# Patient Record
Sex: Female | Born: 1970 | Race: White | Hispanic: No | Marital: Single | Smoking: Never smoker
Health system: Southern US, Community
[De-identification: ages and names within clinical notes are randomized; demographics above are authoritative.]

## PROBLEM LIST (undated history)

## (undated) DIAGNOSIS — G4730 Sleep apnea, unspecified: Secondary | ICD-10-CM

## (undated) DIAGNOSIS — M2550 Pain in unspecified joint: Secondary | ICD-10-CM

## (undated) DIAGNOSIS — R0602 Shortness of breath: Secondary | ICD-10-CM

## (undated) DIAGNOSIS — M25474 Effusion, right foot: Secondary | ICD-10-CM

## (undated) DIAGNOSIS — I10 Essential (primary) hypertension: Secondary | ICD-10-CM

## (undated) DIAGNOSIS — E785 Hyperlipidemia, unspecified: Secondary | ICD-10-CM

## (undated) DIAGNOSIS — R7303 Prediabetes: Secondary | ICD-10-CM

## (undated) DIAGNOSIS — E559 Vitamin D deficiency, unspecified: Secondary | ICD-10-CM

## (undated) DIAGNOSIS — M25471 Effusion, right ankle: Secondary | ICD-10-CM

## (undated) DIAGNOSIS — M549 Dorsalgia, unspecified: Secondary | ICD-10-CM

## (undated) DIAGNOSIS — F419 Anxiety disorder, unspecified: Secondary | ICD-10-CM

## (undated) DIAGNOSIS — J45909 Unspecified asthma, uncomplicated: Secondary | ICD-10-CM

## (undated) DIAGNOSIS — M25475 Effusion, left foot: Secondary | ICD-10-CM

## (undated) HISTORY — DX: Shortness of breath: R06.02

## (undated) HISTORY — DX: Hyperlipidemia, unspecified: E78.5

## (undated) HISTORY — DX: Effusion, left foot: M25.475

## (undated) HISTORY — DX: Prediabetes: R73.03

## (undated) HISTORY — DX: Vitamin D deficiency, unspecified: E55.9

## (undated) HISTORY — DX: Effusion, left ankle: M25.471

## (undated) HISTORY — DX: Unspecified asthma, uncomplicated: J45.909

## (undated) HISTORY — DX: Dorsalgia, unspecified: M54.9

## (undated) HISTORY — DX: Essential (primary) hypertension: I10

## (undated) HISTORY — PX: ADENOIDECTOMY: SUR15

## (undated) HISTORY — DX: Sleep apnea, unspecified: G47.30

## (undated) HISTORY — DX: Effusion, right ankle: M25.471

## (undated) HISTORY — DX: Effusion, right foot: M25.474

## (undated) HISTORY — DX: Anxiety disorder, unspecified: F41.9

## (undated) HISTORY — DX: Pain in unspecified joint: M25.50

---

## 2018-03-20 ENCOUNTER — Encounter: Payer: Self-pay | Admitting: Physician Assistant

## 2018-03-20 ENCOUNTER — Ambulatory Visit: Payer: BC Managed Care – PPO | Admitting: Physician Assistant

## 2018-03-20 ENCOUNTER — Other Ambulatory Visit: Payer: Self-pay

## 2018-03-20 ENCOUNTER — Ambulatory Visit (INDEPENDENT_AMBULATORY_CARE_PROVIDER_SITE_OTHER): Payer: BC Managed Care – PPO

## 2018-03-20 VITALS — BP 152/100 | HR 96 | Temp 97.9°F | Resp 16 | Ht 69.0 in | Wt >= 6400 oz

## 2018-03-20 DIAGNOSIS — R9431 Abnormal electrocardiogram [ECG] [EKG]: Secondary | ICD-10-CM

## 2018-03-20 DIAGNOSIS — I10 Essential (primary) hypertension: Secondary | ICD-10-CM

## 2018-03-20 DIAGNOSIS — E785 Hyperlipidemia, unspecified: Secondary | ICD-10-CM | POA: Diagnosis not present

## 2018-03-20 MED ORDER — CHLORTHALIDONE 25 MG PO TABS: 12.5000 mg | ORAL_TABLET | Freq: Every day | ORAL | 0 refills | Status: DC

## 2018-03-20 MED ORDER — METOPROLOL SUCCINATE ER 25 MG PO TB24: 25.0000 mg | ORAL_TABLET | Freq: Every day | ORAL | 0 refills | Status: DC

## 2018-03-20 NOTE — Progress Notes (Signed)
, °

## 2018-03-20 NOTE — Progress Notes (Signed)
03/23/2018 4:16 PM   DOB: 23-Sep-1971 / MRN: [id number]  SUBJECTIVE:  Sharon Robertson is a 47 y.o. female presenting for evaluation of hypertension.  She tells me she has no history of this.  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  She denies chest pain, shortness of breath, dizziness.  She has No Known Allergies.   She  has a past medical history of Asthma.    She  reports that she has never smoked. She has never used smokeless tobacco. She reports that she does not drink alcohol or use drugs. She  has no sexual activity history on file. The patient  has a past surgical history that includes Adenoidectomy.  Her family history includes Cancer in her father; Diabetes in her father, maternal grandmother, and mother; Heart disease in her brother, maternal grandfather, maternal grandmother, mother, paternal grandfather, and paternal grandmother; Hyperlipidemia in her father and mother; Hypertension in her father and mother.  Review of Systems  Constitutional: Negative for chills, diaphoresis and fever.  Eyes: Negative.   Respiratory: Negative for cough, hemoptysis, sputum production, shortness of breath and wheezing.   Cardiovascular: Negative for chest pain, orthopnea and leg swelling.  Gastrointestinal: Negative for abdominal pain, blood in stool, constipation, diarrhea, heartburn, melena, nausea and vomiting.  Genitourinary: Negative for dysuria, flank pain, frequency, hematuria and urgency.  Skin: Negative for rash.  Neurological: Negative for dizziness, sensory change, speech change, focal weakness and headaches.    The problem list and medications were reviewed and updated by myself where necessary and exist elsewhere in the encounter.   OBJECTIVE:  BP (!) 152/100 (BP Location: Right Arm)   Pulse 96   Temp 97.9 F (36.6 C) (Oral)   Resp 16   Ht  5\' 9"  (1.753 m)   Wt (!) 412 lb 6.4 oz (187.1 kg)   SpO2 99%   BMI 60.90 kg/m   Physical Exam  Constitutional: She appears well-developed and well-nourished. No distress.  Morbid obesity.  HENT:  Right Ear: External ear normal.  Left Ear: External ear normal.  Nose: Nose normal.  Mouth/Throat: Oropharynx is clear and moist. No oropharyngeal exudate.  Cardiovascular: Regular rhythm, S1 normal, S2 normal, normal heart sounds and intact distal pulses. Exam reveals no gallop, no friction rub and no decreased pulses.  No murmur heard. Pulmonary/Chest: Effort normal and breath sounds normal. No stridor. No respiratory distress. She has no wheezes. She has no rales.  Abdominal: Soft. Bowel sounds are normal. She exhibits no distension and no mass. There is no tenderness. There is no rebound and no guarding. No hernia.  Musculoskeletal: She exhibits edema (Trace edema bilaterally). She exhibits no tenderness.  Skin: She is not diaphoretic.   The 10-year ASCVD risk score Denman George(Goff DC Montez HagemanJr., et al., 2013) is: 2.1%   Values used to calculate the score:     Age: 89 years     Sex: Female     Is Non-Hispanic African American: No     Diabetic: No     Tobacco smoker: No     Systolic Blood Pressure: 152 mmHg     Is BP treated: Yes     HDL Cholesterol: 50 mg/dL     Total Cholesterol: 212 mg/dL   CLINICAL DATA:  New onset HTN with leg swelling. No CP or SOB.  EXAM: CHEST -  2 VIEW  COMPARISON:  None.  FINDINGS: The heart size and mediastinal contours are within normal limits. Both lungs are clear. The visualized skeletal structures are unremarkable.  IMPRESSION: No active cardiopulmonary disease.   Electronically Signed   By: Norva Pavlov M.D.   On: 03/20/2018 12:07   EKG shows normal sinus rhythm.  Normal axis.  There is no evidence of ischemia or infarction.  She does meet criteria for left ventricular hypertrophy.  No results found for this or any previous visit (from the  past 72 hour(s)).  No results found.  ASSESSMENT AND PLAN:  [name] was seen today for establish care.  Diagnoses and all orders for this visit:  Asymptomatic hypertension: The she needs some diuresis.  Her hypertension is most likely secondary to her weight and she would like to consider seeing a bariatric surgeon.  Given her findings on EKG I would like her to see cardiology first before we consider aggressive options for weight loss.  I will see her back in about 1 to 2 weeks for blood pressure titration.  I am sending her to [facility] for further evaluation of the LVH found on EKG. -     EKG 12-Lead -     DG Chest 2 View; Future -     CBC -     Hemoglobin A1c -     Basic metabolic panel -     Hepatic function panel -     Lipid panel -     TSH -     chlorthalidone (HYGROTON) 25 MG tablet; Take 0.5 tablets (12.5 mg total) by mouth daily. -     metoprolol succinate (TOPROL-XL) 25 MG 24 hr tablet; Take 1 tablet (25 mg total) by mouth daily.  Abnormal EKG -     Ambulatory referral to Cardiology    The patient is advised to call or return to clinic if she does not see an improvement in symptoms, or to seek the care of the closest emergency department if she worsens with the above plan.   [name], MHS, PA-C Primary Care at [facility] 03/23/2018 4:16 PM

## 2018-03-20 NOTE — Patient Instructions (Signed)
     IF you received an x-ray today, you will receive an invoice from [facility]. Please contact [facility] at [phone] with questions or concerns regarding your invoice.   IF you received labwork today, you will receive an invoice from [facility]. Please contact [facility] at [phone] with questions or concerns regarding your invoice.   Our billing staff will not be able to assist you with questions regarding bills from these companies.  You will be contacted with the lab results as soon as they are available. The fastest way to get your results is to activate your My Chart account. Instructions are located on the last page of this paperwork. If you have not heard from us regarding the results in 2 weeks, please contact this office.     

## 2018-03-22 LAB — CBC
HEMATOCRIT: 37.9 % (ref 34.0–46.6)
Hemoglobin: 11.8 g/dL (ref 11.1–15.9)
MCH: 24.8 pg — ABNORMAL LOW (ref 26.6–33.0)
MCHC: 31.1 g/dL — AB (ref 31.5–35.7)
MCV: 80 fL (ref 79–97)
Platelets: 461 10*3/uL — ABNORMAL HIGH (ref 150–379)
RBC: 4.75 x10E6/uL (ref 3.77–5.28)
RDW: 14.3 % (ref 12.3–15.4)
WBC: 8.4 10*3/uL (ref 3.4–10.8)

## 2018-03-22 LAB — HEPATIC FUNCTION PANEL
ALT: 17 IU/L (ref 0–32)
AST: 15 IU/L (ref 0–40)
Albumin: 4.1 g/dL (ref 3.5–5.5)
Alkaline Phosphatase: 93 IU/L (ref 39–117)
BILIRUBIN TOTAL: 0.3 mg/dL (ref 0.0–1.2)
BILIRUBIN, DIRECT: 0.08 mg/dL (ref 0.00–0.40)
Total Protein: 7.2 g/dL (ref 6.0–8.5)

## 2018-03-22 LAB — LIPID PANEL
CHOLESTEROL TOTAL: 212 mg/dL — AB (ref 100–199)
Chol/HDL Ratio: 4.2 ratio (ref 0.0–4.4)
HDL: 50 mg/dL (ref >39)
LDL Calculated: 145 mg/dL — ABNORMAL HIGH (ref 0–99)
Triglycerides: 86 mg/dL (ref 0–149)
VLDL Cholesterol Cal: 17 mg/dL (ref 5–40)

## 2018-03-22 LAB — BASIC METABOLIC PANEL
BUN / CREAT RATIO: 22 (ref 9–23)
BUN: 15 mg/dL (ref 6–24)
CHLORIDE: 100 mmol/L (ref 96–106)
CO2: 23 mmol/L (ref 20–29)
CREATININE: 0.67 mg/dL (ref 0.57–1.00)
Calcium: 9.2 mg/dL (ref 8.7–10.2)
GFR calc Af Amer: 122 mL/min/{1.73_m2} (ref >59)
GFR calc non Af Amer: 106 mL/min/{1.73_m2} (ref >59)
Glucose: 98 mg/dL (ref 65–99)
Potassium: 4.1 mmol/L (ref 3.5–5.2)
Sodium: 138 mmol/L (ref 134–144)

## 2018-03-22 LAB — TSH: TSH: 2.65 u[IU]/mL (ref 0.450–4.500)

## 2018-03-22 LAB — HEMOGLOBIN A1C
ESTIMATED AVERAGE GLUCOSE: 114 mg/dL
Hgb A1c MFr Bld: 5.6 % (ref 4.8–5.6)

## 2018-03-23 DIAGNOSIS — E785 Hyperlipidemia, unspecified: Secondary | ICD-10-CM | POA: Insufficient documentation

## 2018-03-23 DIAGNOSIS — I10 Essential (primary) hypertension: Secondary | ICD-10-CM | POA: Insufficient documentation

## 2018-03-31 ENCOUNTER — Encounter: Payer: Self-pay | Admitting: Physician Assistant

## 2018-03-31 ENCOUNTER — Other Ambulatory Visit: Payer: Self-pay

## 2018-03-31 ENCOUNTER — Ambulatory Visit: Payer: BC Managed Care – PPO | Admitting: Physician Assistant

## 2018-03-31 VITALS — BP 128/78 | HR 93 | Temp 97.8°F | Resp 20 | Ht 69.0 in | Wt >= 6400 oz

## 2018-03-31 DIAGNOSIS — I10 Essential (primary) hypertension: Secondary | ICD-10-CM

## 2018-03-31 DIAGNOSIS — Z1231 Encounter for screening mammogram for malignant neoplasm of breast: Secondary | ICD-10-CM | POA: Diagnosis not present

## 2018-03-31 DIAGNOSIS — R9431 Abnormal electrocardiogram [ECG] [EKG]: Secondary | ICD-10-CM

## 2018-03-31 MED ORDER — METOPROLOL SUCCINATE ER 25 MG PO TB24: 25.0000 mg | ORAL_TABLET | Freq: Every day | ORAL | 3 refills | Status: DC

## 2018-03-31 MED ORDER — CHLORTHALIDONE 25 MG PO TABS: 12.5000 mg | ORAL_TABLET | Freq: Every day | ORAL | 3 refills | Status: DC

## 2018-03-31 NOTE — Patient Instructions (Signed)
     IF you received an x-ray today, you will receive an invoice from [facility]. Please contact [facility] at [phone] with questions or concerns regarding your invoice.   IF you received labwork today, you will receive an invoice from [facility]. Please contact [facility] at [phone] with questions or concerns regarding your invoice.   Our billing staff will not be able to assist you with questions regarding bills from these companies.  You will be contacted with the lab results as soon as they are available. The fastest way to get your results is to activate your My Chart account. Instructions are located on the last page of this paperwork. If you have not heard from us regarding the results in 2 weeks, please contact this office.     

## 2018-03-31 NOTE — Progress Notes (Signed)
03/31/2018 8:44 AM   DOB: 08/13/1971 / MRN: [id number]  SUBJECTIVE:  Sharon Robertson is a 47 y.o. female presenting for recheck of hypertension.  Patient tells me she has surprised at how much better she is feeling at this time.  She is lost close to 10 pounds with the chlorthalidone and is also taking her metoprolol as prescribed.  She has yet to hear from cardiology plans to go once an appointment is set.  She is requesting a referral to bariatric surgery.  She is never had a serious health consequences due to her excess weight until the recent hypertension diagnosis.  She has now realized what is serious impact at this diagnosis had from a symptomatic standpoint once it was treated.  She has No Known Allergies.   She  has a past medical history of Asthma.    She  reports that she has never smoked. She has never used smokeless tobacco. She reports that she does not drink alcohol or use drugs. She  has no sexual activity history on file. The patient  has a past surgical history that includes Adenoidectomy.  Her family history includes Cancer in her father; Diabetes in her father, maternal grandmother, and mother; Heart disease in her brother, maternal grandfather, maternal grandmother, mother, paternal grandfather, and paternal grandmother; Hyperlipidemia in her father and mother; Hypertension in her father and mother.  Review of Systems  Constitutional: Negative for chills, diaphoresis and fever.  Eyes: Negative.   Respiratory: Negative for cough, hemoptysis, sputum production, shortness of breath and wheezing.   Cardiovascular: Negative for chest pain, orthopnea and leg swelling.  Gastrointestinal: Negative for nausea.  Skin: Negative for rash.  Neurological: Negative for dizziness, sensory change, speech change, focal weakness and headaches.    The problem list and medications were reviewed and updated by myself where necessary and exist elsewhere in the encounter.    OBJECTIVE:  BP 128/78 (BP Location: Left Arm, Patient Position: Sitting, Cuff Size: Large)   Pulse 93   Temp 97.8 F (36.6 C) (Oral)   Resp 20   Ht  (1.753 m)   Wt (!) 404 lb 6.4 oz (183.4 kg)   LMP 03/31/2018   SpO2 97%   BMI 59.72 kg/m   Physical Exam  Constitutional: She is oriented to person, place, and time. She appears well-nourished. No distress.  Eyes: Pupils are equal, round, and reactive to light. EOM are normal.  Cardiovascular: Normal rate, regular rhythm, S1 normal, S2 normal, normal heart sounds and intact distal pulses. Exam reveals no gallop, no friction rub and no decreased pulses.  No murmur heard. Pulmonary/Chest: Effort normal. No stridor. No respiratory distress. She has no wheezes. She has no rales.  Abdominal: She exhibits no distension.  Musculoskeletal: She exhibits no edema.  Neurological: She is alert and oriented to person, place, and time. No cranial nerve deficit. Gait normal.  Skin: Skin is dry. She is not diaphoretic.  Psychiatric: She has a normal mood and affect.  Vitals reviewed.   BP Readings from Last 3 Encounters:  03/31/18 128/78  03/20/18 (!) 152/100   Wt Readings from Last 3 Encounters:  03/31/18 (!) 404 lb 6.4 oz (183.4 kg)  03/20/18 (!) 412 lb 6.4 oz (187.1 kg)   The 10-year ASCVD risk score Denman George DC Jr., et al., 2013) is: 1.5%   Values used to calculate the score:     Age: 33 years     Sex: Female     Is Non-Hispanic  African American: No     Diabetic: No     Tobacco smoker: No     Systolic Blood Pressure: 128 mmHg     Is BP treated: Yes     HDL Cholesterol: 50 mg/dL     Total Cholesterol: 212 mg/dL    No results found for this or any previous visit (from the past 72 hour(s)).  No results found.  ASSESSMENT AND PLAN:  Sharon Robertson was seen today for hypertension and follow-up.  Diagnoses and all orders for this visit:  Well-controlled hypertension -     Renal Function Panel -     Care  order/instruction:  Nonspecific abnormal electrocardiogram (ECG) (EKG): Awaiting cardiology   Asymptomatic hypertension -     metoprolol succinate (TOPROL-XL) 25 MG 24 hr tablet; Take 1 tablet (25 mg total) by mouth daily. -     chlorthalidone (HYGROTON) 25 MG tablet; Take 0.5 tablets (12.5 mg total) by mouth daily.  Visit for screening mammogram -     Ambulatory referral to Obstetrics / Gynecology  Morbid obesity [facility]) -     Ambulatory referral to General Surgery    The patient is advised to call or return to clinic if she does not see an improvement in symptoms, or to seek the care of the closest emergency department if she worsens with the above plan.   [name], MHS, PA-C Primary Care at [facility] 03/31/2018 8:44 AM

## 2018-04-01 LAB — RENAL FUNCTION PANEL
ALBUMIN: 3.8 g/dL (ref 3.5–5.5)
BUN/Creatinine Ratio: 29 — ABNORMAL HIGH (ref 9–23)
BUN: 20 mg/dL (ref 6–24)
CHLORIDE: 101 mmol/L (ref 96–106)
CO2: 23 mmol/L (ref 20–29)
Calcium: 9.2 mg/dL (ref 8.7–10.2)
Creatinine, Ser: 0.7 mg/dL (ref 0.57–1.00)
GFR calc Af Amer: 120 mL/min/{1.73_m2} (ref >59)
GFR, EST NON AFRICAN AMERICAN: 104 mL/min/{1.73_m2} (ref >59)
GLUCOSE: 105 mg/dL — AB (ref 65–99)
POTASSIUM: 4.4 mmol/L (ref 3.5–5.2)
Phosphorus: 2.5 mg/dL (ref 2.5–4.5)
SODIUM: 139 mmol/L (ref 134–144)

## 2018-07-19 ENCOUNTER — Other Ambulatory Visit (HOSPITAL_COMMUNITY): Payer: Self-pay | Admitting: General Surgery

## 2018-07-22 ENCOUNTER — Ambulatory Visit (HOSPITAL_COMMUNITY)
Admission: RE | Admit: 2018-07-22 | Discharge: 2018-07-22 | Disposition: A | Discharge status: Home / Self Care | Payer: BC Managed Care – PPO | Source: Ambulatory Visit | Attending: General Surgery | Admitting: General Surgery

## 2018-08-23 ENCOUNTER — Encounter: Payer: Self-pay | Admitting: Skilled Nursing Facility1

## 2018-08-23 ENCOUNTER — Encounter: Payer: BC Managed Care – PPO | Attending: General Surgery | Admitting: Skilled Nursing Facility1

## 2018-08-23 DIAGNOSIS — Z8042 Family history of malignant neoplasm of prostate: Secondary | ICD-10-CM | POA: Diagnosis not present

## 2018-08-23 DIAGNOSIS — Z713 Dietary counseling and surveillance: Secondary | ICD-10-CM | POA: Insufficient documentation

## 2018-08-23 DIAGNOSIS — R609 Edema, unspecified: Secondary | ICD-10-CM | POA: Diagnosis not present

## 2018-08-23 DIAGNOSIS — Z8261 Family history of arthritis: Secondary | ICD-10-CM | POA: Insufficient documentation

## 2018-08-23 DIAGNOSIS — I10 Essential (primary) hypertension: Secondary | ICD-10-CM | POA: Diagnosis not present

## 2018-08-23 DIAGNOSIS — J45909 Unspecified asthma, uncomplicated: Secondary | ICD-10-CM | POA: Diagnosis not present

## 2018-08-23 DIAGNOSIS — Z8249 Family history of ischemic heart disease and other diseases of the circulatory system: Secondary | ICD-10-CM | POA: Insufficient documentation

## 2018-08-23 DIAGNOSIS — Z833 Family history of diabetes mellitus: Secondary | ICD-10-CM | POA: Insufficient documentation

## 2018-08-23 DIAGNOSIS — E785 Hyperlipidemia, unspecified: Secondary | ICD-10-CM | POA: Diagnosis not present

## 2018-08-23 DIAGNOSIS — K219 Gastro-esophageal reflux disease without esophagitis: Secondary | ICD-10-CM | POA: Insufficient documentation

## 2018-08-23 NOTE — Progress Notes (Signed)
Pre-Op Assessment Visit:  Pre-Operative Sleeve Gastrectomy Surgery  Medical Nutrition Therapy:  Appt start time: 4:45  End time:  6:00  Patient was seen on 08/24/2018 for Pre-Operative Nutrition Assessment. [facility] on 08/24/2018.   Pt states she will finish her masters in April. Pt states she works nights on the weekends. Pt states she feels she has more time to put into her food choices at this time in her life. Pt states she has started to look into her emotional eating and relationship with food. Pt states her psych eval with Dr. Cyndia SkeetersLurey did not go well stating he had an "angery tone which was condesending" and she states she "felt attacked" Pt states that appointment was "triggering" for her.   Pt seems mentally ready for the commitment of surgery.   Pt expectation of surgery: to be healthier and move more  Pt expectation of Dietitian: none   Start weight at [facility]: 421.7 BMI: 62.27   24 hr Dietary Recall: aiming to eat at least 1 vegetable a day First Meal: coffee and bagel or oatmeal or grits Snack: granola bars or yogurt Second Meal: chicken and vegetable  Snack: pretzels  Third Meal: salad or chicken or steak or fish Snack:  Beverages: 40 ounces water, coffee with half anf half and splenda, unsweet tea  Encouraged to engage in 150 minutes of moderate physical activity including cardiovascular and weight baring weekly  Handouts given during visit include:  . Pre-Op Goals . Bariatric Surgery Protein Shakes During the appointment today the following Pre-Op Goals were reviewed with the patient: . Maintain or lose weight as instructed by your surgeon . Make healthy food choices . Begin to limit portion sizes . Limited concentrated sugars and fried foods . Keep fat/sugar in the single digits per serving on             food labels . Practice CHEWING your food  (aim for 30 chews per  bite or until applesauce consistency) . Practice not drinking 15 minutes before, during, and 30 minutes after each meal/snack . Avoid all carbonated beverages  . Avoid/limit caffeinated beverages  . Avoid all sugar-sweetened beverages . Consume 3 meals per day; eat every 3-5 hours . Make a list of non-food related activities . Aim for 64-100 ounces of FLUID daily  . Aim for at least 60-80 grams of PROTEIN daily . Look for a liquid protein source that contain ?15 g protein and ?5 g carbohydrate  (ex: shakes, drinks, shots)  -Follow diet recommendations listed below   Energy and Macronutrient Recomendations: Calories: 1500 Carbohydrate: 170 Protein: 112 Fat: 42  Demonstrated degree of understanding via:  Teach Back  Teaching Method Utilized:  Visual Auditory Hands on  Barriers to learning/adherence to lifestyle change: none identified   Patient to call the [facility] to enroll in Pre-Op and Post-Op Nutrition Education when surgery date is scheduled.

## 2019-05-25 ENCOUNTER — Other Ambulatory Visit: Payer: Self-pay | Admitting: Physician Assistant

## 2019-05-25 DIAGNOSIS — I10 Essential (primary) hypertension: Secondary | ICD-10-CM

## 2019-05-26 ENCOUNTER — Other Ambulatory Visit: Payer: Self-pay | Admitting: Physician Assistant

## 2019-05-26 DIAGNOSIS — I10 Essential (primary) hypertension: Secondary | ICD-10-CM

## 2019-06-26 ENCOUNTER — Other Ambulatory Visit: Payer: Self-pay | Admitting: Family Medicine

## 2019-06-26 DIAGNOSIS — I10 Essential (primary) hypertension: Secondary | ICD-10-CM

## 2019-07-05 ENCOUNTER — Other Ambulatory Visit: Payer: Self-pay

## 2019-07-05 ENCOUNTER — Ambulatory Visit: Payer: BC Managed Care – PPO | Admitting: Registered Nurse

## 2019-07-05 ENCOUNTER — Encounter: Payer: Self-pay | Admitting: Registered Nurse

## 2019-07-05 VITALS — BP 130/86 | HR 82 | Temp 98.4°F | Resp 16 | Ht 69.0 in | Wt >= 6400 oz

## 2019-07-05 DIAGNOSIS — Z1329 Encounter for screening for other suspected endocrine disorder: Secondary | ICD-10-CM

## 2019-07-05 DIAGNOSIS — Z130 Encounter for screening for diseases of the blood and blood-forming organs and certain disorders involving the immune mechanism: Secondary | ICD-10-CM | POA: Diagnosis not present

## 2019-07-05 DIAGNOSIS — Z13220 Encounter for screening for lipoid disorders: Secondary | ICD-10-CM

## 2019-07-05 DIAGNOSIS — Z13228 Encounter for screening for other metabolic disorders: Secondary | ICD-10-CM | POA: Diagnosis not present

## 2019-07-05 NOTE — Progress Notes (Signed)
Established Patient Office Visit  Subjective:  Patient ID: Sharon Robertson, female    DOB: 09/30/1971  Age: 48 y.o. MRN: [id number]  CC:  Chief Complaint  Patient presents with  . Establish Care    need new pcp to manage medications   . Medication Refill    HPI Sharon Robertson presents for visit to establish care. Formerly a patient of Sharon Robertson, she is being treated by [place] for her HTN.  She takes her BP at home and at work, as she is an Therapist, sports at [employer]. Reports it has been steady. She is taking chlorthalidone 25 mg PO qd and metoprolol XR 25mg  PO qd. She has a substantial history of heart disease in her family.  Otherwise, she feels healthy. She received an eye exam last Thursday, 06/30/19, and had a pap/well woman visit with her Gyn in 04/2019 with normal findings.  She reports that while she is trying to lose weight, she has gone down two pant sizes, but has not seen much of a change on the scale - this is concerning for her. We had a discussion about diet and exercise, and discussed that muscle, being more dense than fat, can cause a change in body composition that can shrink the waistline while the scale stays steady.  Past Medical History:  Diagnosis Date  . Asthma   . Hypertension     Past Surgical History:  Procedure Laterality Date  . ADENOIDECTOMY     childhood    Family History  Problem Relation Age of Onset  . Diabetes Mother   . Heart disease Mother   . Hyperlipidemia Mother   . Hypertension Mother   . Cancer Father        prostate  . Diabetes Father   . Hyperlipidemia Father   . Hypertension Father   . Heart disease Brother   . Heart disease Maternal Grandmother   . Diabetes Maternal Grandmother   . Heart disease Maternal Grandfather   . Heart disease Paternal Grandmother   . Heart disease Paternal Grandfather     Social History   Socioeconomic History  . Marital status: Single    Spouse name: Not on file  . Number of children: Not on file   . Years of education: Not on file  . Highest education level: Not on file  Occupational History  . Not on file  Social Needs  . Financial resource strain: Not hard at all  . Food insecurity    Worry: Never true    Inability: Never true  . Transportation needs    Medical: No    Non-medical: No  Tobacco Use  . Smoking status: Never Smoker  . Smokeless tobacco: Never Used  Substance and Sexual Activity  . Alcohol use: Never    Frequency: Never  . Drug use: Never  . Sexual activity: Not on file  Lifestyle  . Physical activity    Days per week: 3 days    Minutes per session: 30 min  . Stress: Not at all  Relationships  . Social Herbalist on phone: Three times a week    Gets together: Twice a week    Attends religious service: Patient refused    Active member of club or organization: Patient refused    Attends meetings of clubs or organizations: Patient refused    Relationship status: Patient refused  . Intimate partner violence    Fear of current or ex partner: No  Emotionally abused: No    Physically abused: No    Forced sexual activity: No  Other Topics Concern  . Not on file  Social History Narrative  . Not on file    Outpatient Medications Prior to Visit  Medication Sig Dispense Refill  . cetirizine (ZYRTEC) 10 MG tablet Take 10 mg by mouth daily.    . chlorthalidone (HYGROTON) 25 MG tablet TAKE 0.5 TABLETS (12.5 MG TOTAL) BY MOUTH DAILY. 45 tablet 0  . metoprolol succinate (TOPROL-XL) 25 MG 24 hr tablet TAKE 1 TABLET BY MOUTH EVERY DAY 30 tablet 0   No facility-administered medications prior to visit.     No Known Allergies  ROS Review of Systems  Constitutional: Negative.   HENT: Negative.   Eyes: Negative.   Respiratory: Negative.   Cardiovascular: Negative.   Gastrointestinal: Negative.   Endocrine: Negative.   Genitourinary: Negative.   Musculoskeletal: Negative.   Skin: Negative.   Allergic/Immunologic: Negative.   Neurological:  Negative.   Hematological: Negative.   Psychiatric/Behavioral: Negative.       Objective:    Physical Exam  Constitutional: She is oriented to person, place, and time. She appears well-developed and well-nourished.  Cardiovascular: Normal rate and regular rhythm.  Pulmonary/Chest: Effort normal. No respiratory distress.  Neurological: She is alert and oriented to person, place, and time.  Skin: Skin is warm and dry. No rash noted. No erythema. No pallor.  Psychiatric: She has a normal mood and affect. Her behavior is normal. Judgment and thought content normal.  Nursing note and vitals reviewed.   BP 130/86   Pulse 82   Temp 98.4 F (36.9 C) (Oral)   Resp 16   Ht 5\' 9"  (1.753 m)   Wt (!) 408 lb (185.1 kg)   LMP 06/21/2019 (Approximate)   SpO2 98%   BMI 60.25 kg/m  Wt Readings from Last 3 Encounters:  07/05/19 (!) 408 lb (185.1 kg)  08/23/18 (!) 421 lb 11.2 oz (191.3 kg)  03/31/18 (!) 404 lb 6.4 oz (183.4 kg)     Health Maintenance Due  Topic Date Due  . HIV Screening  05/13/1986  . PAP SMEAR-Modifier  03/31/2014  . INFLUENZA VACCINE  07/02/2019    There are no preventive care reminders to display for this patient.  Lab Results  Component Value Date   TSH 2.650 03/20/2018   Lab Results  Component Value Date   WBC 8.4 03/20/2018   HGB 11.8 03/20/2018   HCT 37.9 03/20/2018   MCV 80 03/20/2018   PLT 461 (H) 03/20/2018   Lab Results  Component Value Date   NA 139 03/31/2018   K 4.4 03/31/2018   CO2 23 03/31/2018   GLUCOSE 105 (H) 03/31/2018   BUN 20 03/31/2018   CREATININE 0.70 03/31/2018   BILITOT 0.3 03/20/2018   ALKPHOS 93 03/20/2018   AST 15 03/20/2018   ALT 17 03/20/2018   PROT 7.2 03/20/2018   ALBUMIN 3.8 03/31/2018   CALCIUM 9.2 03/31/2018   Lab Results  Component Value Date   CHOL 212 (H) 03/20/2018   Lab Results  Component Value Date   HDL 50 03/20/2018   Lab Results  Component Value Date   LDLCALC 145 (H) 03/20/2018   Lab  Results  Component Value Date   TRIG 86 03/20/2018   Lab Results  Component Value Date   CHOLHDL 4.2 03/20/2018   Lab Results  Component Value Date   HGBA1C 5.6 03/20/2018      Assessment &  Plan:   Problem List Items Addressed This Visit    None    Visit Diagnoses    Screening for endocrine, metabolic and immunity disorder    -  Primary   Relevant Orders   CBC with Differential/Platelet   Comprehensive metabolic panel   Hemoglobin A1c   TSH   Lipid screening       Relevant Orders   Lipid panel      No orders of the defined types were placed in this encounter.   Follow-up: No follow-ups on file.   PLAN  Labs drawn, will follow up as warranted.  Meds refilled x 1 year, will see her then if not sooner.  Patient encouraged to call clinic with any questions, comments, or concerns.   Janeece Ageeichard Jeremia Groot, NP

## 2019-07-06 ENCOUNTER — Other Ambulatory Visit: Payer: Self-pay | Admitting: Registered Nurse

## 2019-07-06 DIAGNOSIS — E785 Hyperlipidemia, unspecified: Secondary | ICD-10-CM

## 2019-07-06 LAB — CBC WITH DIFFERENTIAL/PLATELET
Basophils Absolute: 0.1 10*3/uL (ref 0.0–0.2)
Basos: 1 %
EOS (ABSOLUTE): 0.2 10*3/uL (ref 0.0–0.4)
Eos: 3 %
Hematocrit: 37.7 % (ref 34.0–46.6)
Hemoglobin: 12 g/dL (ref 11.1–15.9)
Immature Grans (Abs): 0 10*3/uL (ref 0.0–0.1)
Immature Granulocytes: 0 %
Lymphocytes Absolute: 2.3 10*3/uL (ref 0.7–3.1)
Lymphs: 27 %
MCH: 24.6 pg — ABNORMAL LOW (ref 26.6–33.0)
MCHC: 31.8 g/dL (ref 31.5–35.7)
MCV: 77 fL — ABNORMAL LOW (ref 79–97)
Monocytes Absolute: 0.5 10*3/uL (ref 0.1–0.9)
Monocytes: 6 %
Neutrophils Absolute: 5.2 10*3/uL (ref 1.4–7.0)
Neutrophils: 63 %
Platelets: 441 10*3/uL (ref 150–450)
RBC: 4.88 x10E6/uL (ref 3.77–5.28)
RDW: 14 % (ref 11.7–15.4)
WBC: 8.2 10*3/uL (ref 3.4–10.8)

## 2019-07-06 LAB — COMPREHENSIVE METABOLIC PANEL
ALT: 25 IU/L (ref 0–32)
AST: 17 IU/L (ref 0–40)
Albumin/Globulin Ratio: 1.4 (ref 1.2–2.2)
Albumin: 4.1 g/dL (ref 3.8–4.8)
Alkaline Phosphatase: 82 IU/L (ref 39–117)
BUN/Creatinine Ratio: 20 (ref 9–23)
BUN: 13 mg/dL (ref 6–24)
Bilirubin Total: 0.3 mg/dL (ref 0.0–1.2)
CO2: 23 mmol/L (ref 20–29)
Calcium: 9.5 mg/dL (ref 8.7–10.2)
Chloride: 100 mmol/L (ref 96–106)
Creatinine, Ser: 0.65 mg/dL (ref 0.57–1.00)
GFR calc Af Amer: 121 mL/min/{1.73_m2} (ref >59)
GFR calc non Af Amer: 105 mL/min/{1.73_m2} (ref >59)
Globulin, Total: 3 g/dL (ref 1.5–4.5)
Glucose: 105 mg/dL — ABNORMAL HIGH (ref 65–99)
Potassium: 4.5 mmol/L (ref 3.5–5.2)
Sodium: 137 mmol/L (ref 134–144)
Total Protein: 7.1 g/dL (ref 6.0–8.5)

## 2019-07-06 LAB — LIPID PANEL
Chol/HDL Ratio: 4.7 ratio — ABNORMAL HIGH (ref 0.0–4.4)
Cholesterol, Total: 259 mg/dL — ABNORMAL HIGH (ref 100–199)
HDL: 55 mg/dL (ref >39)
LDL Calculated: 179 mg/dL — ABNORMAL HIGH (ref 0–99)
Triglycerides: 123 mg/dL (ref 0–149)
VLDL Cholesterol Cal: 25 mg/dL (ref 5–40)

## 2019-07-06 LAB — HEMOGLOBIN A1C
Est. average glucose Bld gHb Est-mCnc: 123 mg/dL
Hgb A1c MFr Bld: 5.9 % — ABNORMAL HIGH (ref 4.8–5.6)

## 2019-07-06 LAB — TSH: TSH: 1.99 u[IU]/mL (ref 0.450–4.500)

## 2019-07-06 MED ORDER — ATORVASTATIN CALCIUM 10 MG PO TABS: 10.0000 mg | ORAL_TABLET | Freq: Every day | ORAL | 1 refills | Status: DC

## 2019-07-06 NOTE — Progress Notes (Signed)
Good afternoon Sharon Robertson,  I left you a voicemail, but in case it gets cut off, I wanted to send you a message as well. 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 Your metabolic panel shows a glucose of 105. This is not concerning. Your TSH is normal. Your A1c is elevated to 5.9% - this is technically a "prediabetic" range, meaning it's not actionable from a pharmacology standpoint, but just as more of a "red flag" in emphasizing that you should limit processed foods, eat mostly vegetables, and rule out sugary drinks. Your Lipid panel has both your cholesterol and LDL taking a jump up from last year - this can be familial, or related to your diet - but I'm tempted to believe the former since you've been working diligently on losing weight. Regardless, this warrants the initiation of Atorvastatin 10mg  PO every evening. This is a low dose of a moderate intensity statin - a very well tolerated and well studied medication that helps your body lower your lipids. There are often no side effects, but if you notice muscle aches, changes to your urine, or stomach pain, please give me a call. I will send this over to your pharmacy Ideally, I'd like to see you back in around 6 months for lab work and a med check - as long as we can see those lipids trending down, we will be in great shape! If not, we can discuss bumping up the atorvastatin to 20mg . If you have any questions, feel free to call or message me via MyChart. It was a pleasure to meet you, looking forward to seeing you soon,  Kathrin Ruddy, NP

## 2019-07-06 NOTE — Progress Notes (Signed)
Left VM and sent results message -  Elevated lipids: cholesterol and LDL Start Atorvastatin 10mg  PO qpm  Follow up in 6 mos with lipid panel and med check  Kathrin Ruddy, NP

## 2019-07-12 ENCOUNTER — Other Ambulatory Visit: Payer: Self-pay | Admitting: Family Medicine

## 2019-07-12 DIAGNOSIS — I10 Essential (primary) hypertension: Secondary | ICD-10-CM

## 2019-07-12 DIAGNOSIS — Z1231 Encounter for screening mammogram for malignant neoplasm of breast: Secondary | ICD-10-CM

## 2019-08-12 ENCOUNTER — Ambulatory Visit
Admission: RE | Admit: 2019-08-12 | Discharge: 2019-08-12 | Disposition: A | Discharge status: Home / Self Care | Payer: BC Managed Care – PPO | Source: Ambulatory Visit | Attending: Family Medicine | Admitting: Family Medicine

## 2019-08-12 ENCOUNTER — Other Ambulatory Visit: Payer: Self-pay

## 2019-08-12 DIAGNOSIS — Z1231 Encounter for screening mammogram for malignant neoplasm of breast: Secondary | ICD-10-CM

## 2019-08-14 ENCOUNTER — Other Ambulatory Visit: Payer: Self-pay | Admitting: Family Medicine

## 2019-08-14 DIAGNOSIS — I10 Essential (primary) hypertension: Secondary | ICD-10-CM

## 2019-08-15 NOTE — Telephone Encounter (Signed)
Requested medication (s) are due for refill today: yes  Requested medication (s) are on the active medication list: yes  Last refill:  05/26/2019  Future visit scheduled:no  Notes to clinic:  vm left for patient to contact office to schedule visit   Requested Prescriptions  Pending Prescriptions Disp Refills   chlorthalidone (HYGROTON) 25 MG tablet [Pharmacy Med Name: CHLORTHALIDONE 25 MG TABLET] 45 tablet 0    Sig: TAKE 1/2 TABLET BY MOUTH EVERY DAY     Cardiovascular: Diuretics - Thiazide Passed - 08/14/2019  2:12 PM      Passed - Ca in normal range and within 360 days    Calcium  Date Value Ref Range Status  07/05/2019 9.5 8.7 - 10.2 mg/dL Final         Passed - Cr in normal range and within 360 days    Creatinine, Ser  Date Value Ref Range Status  07/05/2019 0.65 0.57 - 1.00 mg/dL Final         Passed - K in normal range and within 360 days    Potassium  Date Value Ref Range Status  07/05/2019 4.5 3.5 - 5.2 mmol/L Final         Passed - Na in normal range and within 360 days    Sodium  Date Value Ref Range Status  07/05/2019 137 134 - 144 mmol/L Final         Passed - Last BP in normal range    BP Readings from Last 1 Encounters:  07/05/19 130/86         Passed - Valid encounter within last 6 months    Recent Outpatient Visits          1 month ago Screening for endocrine, metabolic and immunity disorder   [facility], Delfino Lovett, NP   1 year ago Well-controlled hypertension   [facility], Audrie Lia, PA-C   1 year ago Asymptomatic hypertension   [facility] at [facility], Audrie Lia, PA-C

## 2019-09-05 ENCOUNTER — Other Ambulatory Visit: Payer: Self-pay | Admitting: Registered Nurse

## 2019-09-05 DIAGNOSIS — I10 Essential (primary) hypertension: Secondary | ICD-10-CM

## 2019-09-30 ENCOUNTER — Other Ambulatory Visit: Payer: Self-pay | Admitting: Registered Nurse

## 2019-09-30 DIAGNOSIS — I10 Essential (primary) hypertension: Secondary | ICD-10-CM

## 2019-10-24 ENCOUNTER — Other Ambulatory Visit: Payer: Self-pay | Admitting: Registered Nurse

## 2019-10-24 DIAGNOSIS — I10 Essential (primary) hypertension: Secondary | ICD-10-CM

## 2019-10-24 NOTE — Telephone Encounter (Signed)
Refilled medication for 30 days. 

## 2019-11-19 ENCOUNTER — Other Ambulatory Visit: Payer: Self-pay | Admitting: Registered Nurse

## 2019-11-19 DIAGNOSIS — I10 Essential (primary) hypertension: Secondary | ICD-10-CM

## 2019-12-15 ENCOUNTER — Other Ambulatory Visit: Payer: Self-pay | Admitting: Registered Nurse

## 2019-12-15 DIAGNOSIS — I10 Essential (primary) hypertension: Secondary | ICD-10-CM

## 2019-12-15 NOTE — Telephone Encounter (Signed)
Your patient 

## 2019-12-27 ENCOUNTER — Other Ambulatory Visit: Payer: Self-pay | Admitting: Registered Nurse

## 2019-12-27 DIAGNOSIS — E785 Hyperlipidemia, unspecified: Secondary | ICD-10-CM

## 2020-01-04 ENCOUNTER — Other Ambulatory Visit: Payer: Self-pay

## 2020-01-04 ENCOUNTER — Encounter: Payer: Self-pay | Admitting: Registered Nurse

## 2020-01-04 ENCOUNTER — Ambulatory Visit (INDEPENDENT_AMBULATORY_CARE_PROVIDER_SITE_OTHER): Payer: BC Managed Care – PPO | Admitting: Registered Nurse

## 2020-01-04 VITALS — BP 138/94 | HR 111 | Temp 97.2°F | Ht 69.0 in | Wt >= 6400 oz

## 2020-01-04 DIAGNOSIS — I10 Essential (primary) hypertension: Secondary | ICD-10-CM | POA: Diagnosis not present

## 2020-01-04 DIAGNOSIS — R7303 Prediabetes: Secondary | ICD-10-CM

## 2020-01-04 DIAGNOSIS — E785 Hyperlipidemia, unspecified: Secondary | ICD-10-CM

## 2020-01-04 DIAGNOSIS — Z6841 Body Mass Index (BMI) 40.0 and over, adult: Secondary | ICD-10-CM

## 2020-01-04 MED ORDER — ATORVASTATIN CALCIUM 10 MG PO TABS: 10.0000 mg | ORAL_TABLET | Freq: Every day | ORAL | 3 refills | Status: DC

## 2020-01-04 NOTE — Progress Notes (Signed)
Established Patient Office Visit  Subjective:  Patient ID: Sharon Robertson, female    DOB: 1971/03/09  Age: 49 y.o. MRN: [id number]  CC:  Chief Complaint  Patient presents with  . Medication Refill    for atorvastatin    HPI Sharon Robertson presents for labs and med refill  Needs atorvastatin refilled. Has been tolerating well.   Has been continuing on mediterranean diet and exercising minimum 3 days a week for 30 minutes at a time. 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 Enjoys it. 1900ish students on campus this semester. Not seeing too many complex cases at the moment.  Past Medical History:  Diagnosis Date  . Asthma   . Hypertension     Past Surgical History:  Procedure Laterality Date  . ADENOIDECTOMY     childhood    Family History  Problem Relation Age of Onset  . Diabetes Mother   . Heart disease Mother   . Hyperlipidemia Mother   . Hypertension Mother   . Cancer Father        prostate  . Diabetes Father   . Hyperlipidemia Father   . Hypertension Father   . Heart disease Brother   . Heart disease Maternal Grandmother   . Diabetes Maternal Grandmother   . Heart disease Maternal Grandfather   . Heart disease Paternal Grandmother   . Heart disease Paternal Grandfather     Social History   Socioeconomic History  . Marital status: Single    Spouse name: Not on file  . Number of children: Not on file  . Years of education: Not on file  . Highest education level: Not on file  Occupational History  . Not on file  Tobacco Use  . Smoking status: Never Smoker  . Smokeless tobacco: Never Used  Substance and Sexual Activity  . Alcohol use: Never  . Drug use: Never  . Sexual activity: Not on file  Other Topics Concern  . Not on file  Social History Narrative  . Not on file   Social Determinants of Health   Financial Resource Strain: Low Risk   . Difficulty of Paying Living  Expenses: Not hard at all  Food Insecurity: No Food Insecurity  . Worried About Charity fundraiser in the Last Year: Never true  . Ran Out of Food in the Last Year: Never true  Transportation Needs: No Transportation Needs  . Lack of Transportation (Medical): No  . Lack of Transportation (Non-Medical): No  Physical Activity: Insufficiently Active  . Days of Exercise per Week: 3 days  . Minutes of Exercise per Session: 30 min  Stress: No Stress Concern Present  . Feeling of Stress : Not at all  Social Connections: Unknown  . Frequency of Communication with Friends and Family: Three times a week  . Frequency of Social Gatherings with Friends and Family: Twice a week  . Attends Religious Services: Patient refused  . Active Member of Clubs or Organizations: Patient refused  . Attends Archivist Meetings: Patient refused  . Marital Status: Patient refused  Intimate Partner Violence: Not At Risk  . Fear of Current or Ex-Partner: No  . Emotionally Abused: No  . Physically Abused: No  . Sexually Abused: No    Outpatient Medications Prior to Visit  Medication Sig Dispense Refill  . atorvastatin (LIPITOR) 10 MG tablet TAKE 1 TABLET BY MOUTH EVERY DAY 90 tablet 0  .  cetirizine (ZYRTEC) 10 MG tablet Take 10 mg by mouth daily.    . chlorthalidone (HYGROTON) 25 MG tablet TAKE 1/2 TABLET BY MOUTH EVERY DAY 90 tablet 3  . metoprolol succinate (TOPROL-XL) 25 MG 24 hr tablet TAKE 1 TABLET BY MOUTH EVERY DAY 30 tablet 1   No facility-administered medications prior to visit.    No Known Allergies  ROS Review of Systems  Constitutional: Negative.   HENT: Negative.   Eyes: Negative.   Respiratory: Negative.   Cardiovascular: Negative.   Gastrointestinal: Negative.   Endocrine: Negative.   Genitourinary: Negative.   Musculoskeletal: Negative.   Skin: Negative.   Allergic/Immunologic: Negative.   Neurological: Negative.   Hematological: Negative.   Psychiatric/Behavioral:  Negative.   All other systems reviewed and are negative.     Objective:    Physical Exam  Constitutional: She is oriented to person, place, and time. She appears well-developed and well-nourished. No distress.  Cardiovascular: Normal rate, regular rhythm and normal heart sounds.  Pulmonary/Chest: Effort normal. No respiratory distress.  Neurological: She is alert and oriented to person, place, and time.  Skin: Skin is warm and dry. No rash noted. She is not diaphoretic. No erythema. No pallor.  Psychiatric: She has a normal mood and affect. Her behavior is normal. Judgment and thought content normal.  Nursing note and vitals reviewed.   BP (!) 138/94   Pulse (!) 111   Temp (!) 97.2 F (36.2 C) (Temporal)   Ht 5\' 9"  (1.753 m)   Wt (!) 411 lb (186.4 kg)   SpO2 96%   BMI 60.69 kg/m  Wt Readings from Last 3 Encounters:  01/04/20 (!) 411 lb (186.4 kg)  07/05/19 (!) 408 lb (185.1 kg)  08/23/18 (!) 421 lb 11.2 oz (191.3 kg)     Health Maintenance Due  Topic Date Due  . HIV Screening  05/13/1986  . PAP SMEAR-Modifier  03/31/2014    There are no preventive care reminders to display for this patient.  Lab Results  Component Value Date   TSH 1.990 07/05/2019   Lab Results  Component Value Date   WBC 8.2 07/05/2019   HGB 12.0 07/05/2019   HCT 37.7 07/05/2019   MCV 77 (L) 07/05/2019   PLT 441 07/05/2019   Lab Results  Component Value Date   NA 137 07/05/2019   K 4.5 07/05/2019   CO2 23 07/05/2019   GLUCOSE 105 (H) 07/05/2019   BUN 13 07/05/2019   CREATININE 0.65 07/05/2019   BILITOT 0.3 07/05/2019   ALKPHOS 82 07/05/2019   AST 17 07/05/2019   ALT 25 07/05/2019   PROT 7.1 07/05/2019   ALBUMIN 4.1 07/05/2019   CALCIUM 9.5 07/05/2019   Lab Results  Component Value Date   CHOL 259 (H) 07/05/2019   Lab Results  Component Value Date   HDL 55 07/05/2019   Lab Results  Component Value Date   LDLCALC 179 (H) 07/05/2019   Lab Results  Component Value Date    TRIG 123 07/05/2019   Lab Results  Component Value Date   CHOLHDL 4.7 (H) 07/05/2019   Lab Results  Component Value Date   HGBA1C 5.9 (H) 07/05/2019      Assessment & Plan:   Problem List Items Addressed This Visit      Other   Dyslipidemia   Relevant Orders   Lipid Panel    Other Visit Diagnoses    Well-controlled hypertension    -  Primary   Relevant Orders  Lipid Panel   Hemoglobin A1c   CBC   Prediabetes       Relevant Orders   Lipid Panel   Hemoglobin A1c   Class 3 severe obesity with body mass index (BMI) of 60.0 to 69.9 in adult, unspecified obesity type, unspecified whether serious comorbidity present (HCC)       Relevant Orders   Amb Ref to Medical Weight Management      Meds ordered this encounter  Medications  . atorvastatin (LIPITOR) 10 MG tablet    Sig: Take 1 tablet (10 mg total) by mouth daily.    Dispense:  90 tablet    Refill:  3    Order Specific Question:   Supervising Provider    Answer:   Doristine Bosworth [id number]    Follow-up: No follow-ups on file.   PLAN  Labs drawn. If lipids continue rising or stay steady, may plan on 6 mo follow up.  Recheck a1c and cbc to monitor past abnormalities  Resume atorvastatin 10mg  PO qd with dinner. May increase if lipids still elevated  Refer to medical weight management  Patient encouraged to call clinic with any questions, comments, or concerns.  , NP

## 2020-01-04 NOTE — Patient Instructions (Signed)
° ° ° °  If you have lab work done today you will be contacted with your lab results within the next 2 weeks.  If you have not heard from us then please contact us. The fastest way to get your results is to register for My Chart. ° ° °IF you received an x-ray today, you will receive an invoice from [facility]. Please contact [facility] at [phone] with questions or concerns regarding your invoice.  ° °IF you received labwork today, you will receive an invoice from [facility]. Please contact [facility] at [phone] with questions or concerns regarding your invoice.  ° °Our billing staff will not be able to assist you with questions regarding bills from these companies. ° °You will be contacted with the lab results as soon as they are available. The fastest way to get your results is to activate your My Chart account. Instructions are located on the last page of this paperwork. If you have not heard from us regarding the results in 2 weeks, please contact this office. °  ° ° ° °

## 2020-01-05 LAB — HEMOGLOBIN A1C
Est. average glucose Bld gHb Est-mCnc: 123 mg/dL
Hgb A1c MFr Bld: 5.9 % — ABNORMAL HIGH (ref 4.8–5.6)

## 2020-01-05 LAB — CBC
Hematocrit: 38.3 % (ref 34.0–46.6)
Hemoglobin: 12.5 g/dL (ref 11.1–15.9)
MCH: 25.2 pg — ABNORMAL LOW (ref 26.6–33.0)
MCHC: 32.6 g/dL (ref 31.5–35.7)
MCV: 77 fL — ABNORMAL LOW (ref 79–97)
Platelets: 465 10*3/uL — ABNORMAL HIGH (ref 150–450)
RBC: 4.96 x10E6/uL (ref 3.77–5.28)
RDW: 14 % (ref 11.7–15.4)
WBC: 9.7 10*3/uL (ref 3.4–10.8)

## 2020-01-05 LAB — LIPID PANEL
Chol/HDL Ratio: 4.2 ratio (ref 0.0–4.4)
Cholesterol, Total: 202 mg/dL — ABNORMAL HIGH (ref 100–199)
HDL: 48 mg/dL (ref >39)
LDL Chol Calc (NIH): 132 mg/dL — ABNORMAL HIGH (ref 0–99)
Triglycerides: 124 mg/dL (ref 0–149)
VLDL Cholesterol Cal: 22 mg/dL (ref 5–40)

## 2020-01-06 ENCOUNTER — Encounter: Payer: Self-pay | Admitting: Registered Nurse

## 2020-01-15 ENCOUNTER — Other Ambulatory Visit: Payer: Self-pay | Admitting: Registered Nurse

## 2020-01-15 DIAGNOSIS — I10 Essential (primary) hypertension: Secondary | ICD-10-CM

## 2020-01-24 ENCOUNTER — Encounter: Payer: Self-pay | Admitting: Registered Nurse

## 2020-01-25 NOTE — Telephone Encounter (Signed)
LVM letting the patient know that an welcome e-mail was sent on 01/07/2020 @ 8:39 pm

## 2020-01-31 ENCOUNTER — Encounter (INDEPENDENT_AMBULATORY_CARE_PROVIDER_SITE_OTHER): Payer: Self-pay

## 2020-02-09 ENCOUNTER — Encounter (INDEPENDENT_AMBULATORY_CARE_PROVIDER_SITE_OTHER): Payer: Self-pay | Admitting: Family Medicine

## 2020-02-09 ENCOUNTER — Other Ambulatory Visit: Payer: Self-pay

## 2020-02-09 ENCOUNTER — Ambulatory Visit (INDEPENDENT_AMBULATORY_CARE_PROVIDER_SITE_OTHER): Payer: BC Managed Care – PPO | Admitting: Family Medicine

## 2020-02-09 VITALS — BP 110/73 | HR 84 | Temp 98.0°F | Ht 69.0 in | Wt >= 6400 oz

## 2020-02-09 DIAGNOSIS — R5383 Other fatigue: Secondary | ICD-10-CM | POA: Diagnosis not present

## 2020-02-09 DIAGNOSIS — I10 Essential (primary) hypertension: Secondary | ICD-10-CM | POA: Diagnosis not present

## 2020-02-09 DIAGNOSIS — E7849 Other hyperlipidemia: Secondary | ICD-10-CM | POA: Diagnosis not present

## 2020-02-09 DIAGNOSIS — Z1331 Encounter for screening for depression: Secondary | ICD-10-CM

## 2020-02-09 DIAGNOSIS — Z9189 Other specified personal risk factors, not elsewhere classified: Secondary | ICD-10-CM

## 2020-02-09 DIAGNOSIS — R0602 Shortness of breath: Secondary | ICD-10-CM

## 2020-02-09 DIAGNOSIS — R7303 Prediabetes: Secondary | ICD-10-CM

## 2020-02-09 DIAGNOSIS — Z6841 Body Mass Index (BMI) 40.0 and over, adult: Secondary | ICD-10-CM

## 2020-02-09 DIAGNOSIS — G4719 Other hypersomnia: Secondary | ICD-10-CM

## 2020-02-09 DIAGNOSIS — Z0289 Encounter for other administrative examinations: Secondary | ICD-10-CM

## 2020-02-09 NOTE — Progress Notes (Signed)
Dear Janeece Agee, NP,   Thank you for referring Sharon Robertson to our clinic. The following note includes my evaluation and treatment recommendations.  Chief Complaint:   OBESITY DERIKA ECKLES (MR# [id number]) is a 49 y.o. female who presents for evaluation and treatment of obesity and related comorbidities. Current BMI is Body mass index is 60.55 kg/m. Grisell has been struggling with her weight for many years and has been unsuccessful in either losing weight, maintaining weight loss, or reaching her healthy weight goal.  Jandi is currently in the action stage of change and ready to dedicate time achieving and maintaining a healthier weight. Karington is interested in becoming our patient and working on intensive lifestyle modifications including (but not limited to) diet and exercise for weight loss.  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  She walks for 30 minutes 3 days a week for exercise.  She was previously successful with Weight Watchers - accountability.  Tashauna provided the following food recall:  Breakfast:  She reports struggling with breakfast - she has tried oatmeal, grits, bagel, smoothies.  She is hungry by midmorning.   Lizzett's habits were reviewed today and are as follows: she thinks her family will eat healthier with her, her desired weight loss is 210 pounds, she has been heavy most of her life, she started gaining weight in high school, her heaviest weight ever was her current weight, she craves salty foods, she is frequently drinking liquids with calories, she frequently makes poor food choices, she frequently eats larger portions than normal and she struggles with emotional eating.  Depression Screen Shayra's Food and Mood (modified PHQ-9) score was 9.  Depression screen PHQ 2/9 02/09/2020  Decreased Interest 1  Down, Depressed, Hopeless 1  PHQ - 2 Score 2  Altered sleeping 1  Tired, decreased energy 1  Change in appetite 2  Feeling bad or failure about  yourself  2  Trouble concentrating 1  Moving slowly or fidgety/restless 0  Suicidal thoughts 0  PHQ-9 Score 9  Difficult doing work/chores Not difficult at all   Subjective:   1. Other fatigue Shelvie admits to daytime somnolence and reports waking up still tired. Patent has a history of symptoms of morning fatigue, morning headache and snoring. Abigail generally gets 8 hours of sleep per night, and states that she has generally restful sleep. Snoring is present. Apneic episodes are not present. Epworth Sleepiness Score is 8.  2. SOB (shortness of breath) on exertion French Ana notes increasing shortness of breath with exercising and seems to be worsening over time with weight gain. She notes getting out of breath sooner with activity than she used to. This has gotten worse recently. Beatryce denies shortness of breath at rest or orthopnea.  3. Essential hypertension Review: taking medications as instructed, no medication side effects noted, no chest pain on exertion, no dyspnea on exertion, no swelling of ankles.  Allicia takes chlorthalidone and metoprolol for blood pressure.   BP Readings from Last 3 Encounters:  02/09/20 110/73  01/04/20 (!) 138/94  07/05/19 130/86   4. Other hyperlipidemia Andersyn has hyperlipidemia and has been trying to improve her cholesterol levels with intensive lifestyle modification including a low saturated fat diet, exercise and weight loss. She denies any chest pain, claudication or myalgias.  She is taking atorvastatin.  Lab Results  Component Value Date   ALT 25 07/05/2019   AST 17 07/05/2019   ALKPHOS 82 07/05/2019   BILITOT 0.3  07/05/2019   Lab Results  Component Value Date   CHOL 202 (H) 01/04/2020   HDL 48 01/04/2020   LDLCALC 132 (H) 01/04/2020   TRIG 124 01/04/2020   CHOLHDL 4.2 01/04/2020   5. Excessive daytime sleepiness Quintessa endorses snoring and morning headaches 1-2 days a week.  Her Epworth score is 8.  6. Prediabetes Rayyan has a diagnosis of  prediabetes based on her elevated HgA1c and was informed this puts her at greater risk of developing diabetes. She continues to work on diet and exercise to decrease her risk of diabetes. She denies nausea or hypoglycemia.  Lab Results  Component Value Date   HGBA1C 5.9 (H) 01/04/2020   7. Depression screening Farryn was screened for depression as a regular part of her new patient workup.  8. At risk for heart disease Minh is at a higher than average risk for cardiovascular disease due to obesity. Reviewed: no chest pain on exertion, no dyspnea on exertion, and no swelling of ankles.  Assessment/Plan:   1. Other fatigue Layah does feel that her weight is causing her energy to be lower than it should be. Fatigue may be related to obesity, depression or many other causes. Labs will be ordered, and in the meanwhile, Ameri will focus on self care including making healthy food choices, increasing physical activity and focusing on stress reduction.  Orders - EKG 12-Lead - VITAMIN D 25 Hydroxy (Vit-D Deficiency, Fractures) - Anemia panel  2. SOB (shortness of breath) on exertion Milagros does feel that she gets out of breath more easily that she used to when she exercises. Jadeyn's shortness of breath appears to be obesity related and exercise induced. She has agreed to work on weight loss and gradually increase exercise to treat her exercise induced shortness of breath. Will continue to monitor closely.  3. Essential hypertension Maddux is working on healthy weight loss and exercise to improve blood pressure control. We will watch for signs of hypotension as she continues her lifestyle modifications.  4. Other hyperlipidemia Cardiovascular risk and specific lipid/LDL goals reviewed.  We discussed several lifestyle modifications today and Kylyn will continue to work on diet, exercise and weight loss efforts. Orders and follow up as documented in patient record.   Counseling Intensive lifestyle  modifications are the first line treatment for this issue. . Dietary changes: Increase soluble fiber. Decrease simple carbohydrates. . Exercise changes: Moderate to vigorous-intensity aerobic activity 150 minutes per week if tolerated. . Lipid-lowering medications: see documented in medical record.  5. Excessive daytime sleepiness Will refer to a sleep specialist, as below.  Orders - Ambulatory referral to Neurology  6. Prediabetes Shelsea will continue to work on weight loss, exercise, and decreasing simple carbohydrates to help decrease the risk of diabetes.   7. Depression screening Jamilee had a positive depression screening. Depression is commonly associated with obesity and often results in emotional eating behaviors. We will monitor this closely and work on CBT to help improve the non-hunger eating patterns. Referral to Psychology may be required if no improvement is seen as she continues in our clinic.  8. At risk for heart disease Kelly was given approximately 15 minutes of coronary artery disease prevention counseling today. She is 49 y.o. female and has risk factors for heart disease including obesity. We discussed intensive lifestyle modifications today with an emphasis on specific weight loss instructions and strategies.   Repetitive spaced learning was employed today to elicit superior memory formation and behavioral change.  9. Class 3  severe obesity with serious comorbidity and body mass index (BMI) of 60.0 to 69.9 in adult, unspecified obesity type (HCC) Lukisha is currently in the action stage of change and her goal is to continue with weight loss efforts. I recommend Kenley begin the structured treatment plan as follows:  She has agreed to the Category 4 Plan.  Exercise goals: No exercise has been prescribed at this time.   Behavioral modification strategies: increasing lean protein intake, decreasing simple carbohydrates, increasing vegetables, increasing water intake and  decreasing liquid calories.  She was informed of the importance of frequent follow-up visits to maximize her success with intensive lifestyle modifications for her multiple health conditions. She was informed we would discuss her lab results at her next visit unless there is a critical issue that needs to be addressed sooner. Katera agreed to keep her next visit at the agreed upon time to discuss these results.  Objective:   Blood pressure 110/73, pulse 84, temperature 98 F (36.7 C), temperature source Oral, height 5\' 9"  (1.753 m), weight (!) 410 lb (186 kg), last menstrual period 01/26/2020, SpO2 97 %. Body mass index is 60.55 kg/m.  EKG: Normal sinus rhythm, rate 92 bpm.  Indirect Calorimeter completed today shows a VO2 of 335 and a REE of 2329.  Her calculated basal metabolic rate is 01/28/2020 thus her basal metabolic rate is better than expected.  General: Cooperative, alert, well developed, in no acute distress. HEENT: Conjunctivae and lids unremarkable. Cardiovascular: Regular rhythm.  Lungs: Normal work of breathing. Neurologic: No focal deficits.   Lab Results  Component Value Date   CREATININE 0.65 07/05/2019   BUN 13 07/05/2019   NA 137 07/05/2019   K 4.5 07/05/2019   CL 100 07/05/2019   CO2 23 07/05/2019   Lab Results  Component Value Date   ALT 25 07/05/2019   AST 17 07/05/2019   ALKPHOS 82 07/05/2019   BILITOT 0.3 07/05/2019   Lab Results  Component Value Date   HGBA1C 5.9 (H) 01/04/2020   HGBA1C 5.9 (H) 07/05/2019   HGBA1C 5.6 03/20/2018   Lab Results  Component Value Date   TSH 1.990 07/05/2019   Lab Results  Component Value Date   CHOL 202 (H) 01/04/2020   HDL 48 01/04/2020   LDLCALC 132 (H) 01/04/2020   TRIG 124 01/04/2020   CHOLHDL 4.2 01/04/2020   Lab Results  Component Value Date   WBC 9.7 01/04/2020   HGB 12.5 01/04/2020   HCT 38.3 01/04/2020   MCV 77 (L) 01/04/2020   PLT 465 (H) 01/04/2020   Attestation Statements:   This is the  patient's first visit at [facility]. The patient's NEW PATIENT PACKET was reviewed at length. Included in the packet: current and past health history, medications, allergies, ROS, gynecologic history (women only), surgical history, family history, social history, weight history, weight loss surgery history (for those that have had weight loss surgery), nutritional evaluation, mood and food questionnaire, PHQ9, Epworth questionnaire, sleep habits questionnaire, patient life and health improvement goals questionnaire. These will all be scanned into the patient's chart under media.   During the visit, I independently reviewed the patient's EKG, bioimpedance scale results, and indirect calorimeter results. I used this information to tailor a meal plan for the patient that will help her to lose weight and will improve her obesity-related conditions going forward. I performed a medically necessary appropriate examination and/or evaluation. I discussed the assessment and treatment plan with the patient. The patient was provided  an opportunity to ask questions and all were answered. The patient agreed with the plan and demonstrated an understanding of the instructions. Labs were ordered at this visit and will be reviewed at the next visit unless more critical results need to be addressed immediately. Clinical information was updated and documented in the EMR.   I, Insurance claims handler, CMA, am acting as Energy manager for [employer], DO.  I have reviewed the above documentation for accuracy and completeness, and I agree with the above. Helane Rima, DO

## 2020-02-10 LAB — ANEMIA PANEL
Ferritin: 42 ng/mL (ref 15–150)
Folate, Hemolysate: 383 ng/mL
Folate, RBC: 982 ng/mL (ref >498)
Hematocrit: 39 % (ref 34.0–46.6)
Iron Saturation: 26 % (ref 15–55)
Iron: 93 ug/dL (ref 27–159)
Retic Ct Pct: 1.5 % (ref 0.6–2.6)
Total Iron Binding Capacity: 363 ug/dL (ref 250–450)
UIBC: 270 ug/dL (ref 131–425)
Vitamin B-12: 290 pg/mL (ref 232–1245)

## 2020-02-10 LAB — VITAMIN D 25 HYDROXY (VIT D DEFICIENCY, FRACTURES): Vit D, 25-Hydroxy: 20.6 ng/mL — ABNORMAL LOW (ref 30.0–100.0)

## 2020-02-21 ENCOUNTER — Other Ambulatory Visit: Payer: Self-pay

## 2020-02-21 ENCOUNTER — Ambulatory Visit: Payer: BC Managed Care – PPO | Admitting: Neurology

## 2020-02-21 ENCOUNTER — Encounter: Payer: Self-pay | Admitting: Neurology

## 2020-02-21 VITALS — BP 152/97 | HR 79 | Temp 97.1°F | Ht 69.0 in | Wt >= 6400 oz

## 2020-02-21 DIAGNOSIS — E785 Hyperlipidemia, unspecified: Secondary | ICD-10-CM

## 2020-02-21 DIAGNOSIS — R0683 Snoring: Secondary | ICD-10-CM

## 2020-02-21 DIAGNOSIS — Z6841 Body Mass Index (BMI) 40.0 and over, adult: Secondary | ICD-10-CM

## 2020-02-21 DIAGNOSIS — I10 Essential (primary) hypertension: Secondary | ICD-10-CM | POA: Diagnosis not present

## 2020-02-21 DIAGNOSIS — R519 Headache, unspecified: Secondary | ICD-10-CM

## 2020-02-21 NOTE — Patient Instructions (Signed)
Obesity Hypoventilation Syndrome  Obesity hypoventilation syndrome (OHS) means that you are not breathing well enough to get air in and out of your lungs efficiently (ventilation). This causes a low oxygen level and a high carbon dioxide level in your blood (hypoventilation). Having too much total body fat (obesity) is a significant risk factor for developing OHS. OHS makes it harder for your heart to pump oxygen-rich blood to your body. It can cause sleep disturbances and make you feel sleepy during the day. Over time, OHS can increase your risk for:  Heart disease.  High blood pressure (hypertension).  Reduced ability to absorb sugar from the bloodstream (insulin resistance).  Heart failure. Over time, OHS weakens your heart and can lead to heart failure. What are the causes? The exact cause of OHS is not known. Possible causes include:  Pressure on the lungs from excess body weight.  Obesity-related changes in how much air the lungs can hold (lung capacity) and how much they can expand (lung compliance).  Failure of the brain to regulate oxygen and carbon dioxide levels properly.  Chemicals (hormones) produced by excess fat cells interfering with breathing regulation.  A breathing condition in which breathing pauses or becomes shallow during sleep (sleep apnea). This condition can eventually cause the body to ventilate poorly and to hold onto carbon dioxide during the day. What increases the risk? You may have a greater risk for OHS if you:  Have a BMI of 30 or higher. BMI is an estimate of body fat that is calculated from height and weight. For adults, a BMI of 30 or higher is considered obese.  Are 40?49 years old.  Carry most of your excess weight around your waist.  Experience moderate symptoms of sleep apnea. What are the signs or symptoms? The most common symptoms of OHS are:  Daytime sleepiness.  Lack of energy.  Shortness of breath.  Morning headaches.  Sleep  apnea.  Trouble concentrating.  Irritability, mood swings, or depression.  Swollen veins in the neck.  Swelling of the legs. How is this diagnosed? Your health care provider may suspect OHS if you are obese and have poor breathing during the day and at night. Your health care provider will also do a physical exam. You may have tests to:  Measure your BMI.  Measure your blood oxygen level with a sensor placed on your finger (pulse oximetry).  Measure blood oxygen and carbon dioxide in a blood sample.  Measure the amount of red blood cells in a blood sample. OHS causes the number of red blood cells you have to increase (polycythemia).  Check your breathing ability (pulmonary function testing).  Check your breathing ability, breathing patterns, and oxygen level while you sleep (sleep study). You may also have a chest X-ray to rule out other breathing problems. You may have an electrocardiogram (ECG) and or echocardiogram to check for signs of heart failure. How is this treated? Weight loss is the most important part of treatment for OHS, and it may be the only treatment that you need. Other treatments may include:  Using a device to open your airway while you sleep, such as a continuous positive airway pressure (CPAP) machine that delivers oxygen to your airway through a mask.  Surgery (gastric bypass surgery) to lower your BMI. This may be needed if: ? You are very obese. ? Other treatments have not worked for you. ? Your OHS is very severe and is causing organ damage, such as heart failure. Follow these   instructions at home:  Medicines  Take over-the-counter and prescription medicines only as told by your health care provider.  Ask your health care provider what medicines are safe for you. You may be told to avoid medicines that can impair breathing and make OHS worse, such as sedatives and narcotics. Sleeping habits  If you are prescribed a CPAP machine, make sure you  understand and use the machine as directed.  Try to get 8 hours of sleep every night.  Go to bed at the same time every night, and get up at the same time every day. General instructions  Work with your health care provider to make a diet and exercise plan that helps you reach and maintain a healthy weight.  Eat a healthy diet.  Avoid smoking.  Exercise regularly as told by your health care provider.  During the evening, do not drink caffeine and do not eat heavy meals.  Keep all follow-up visits as told by your health care provider. This is important. Contact a health care provider if:  You experience new or worsening shortness of breath.  You have chest pain.  You have an irregular heartbeat (palpitations).  You have dizziness.  You faint.  You develop a cough.  You have a fever.  You have chest pain when you breathe (pleurisy). This information is not intended to replace advice given to you by your health care provider. Make sure you discuss any questions you have with your health care provider. Document Revised: 03/11/2019 Document Reviewed: 04/28/2016 Elsevier Patient Education  2020 Elsevier Inc. Quality Sleep Information, Adult Quality sleep is important for your mental and physical health. It also improves your quality of life. Quality sleep means you:  Are asleep for most of the time you are in bed.  Fall asleep within 30 minutes.  Wake up no more than once a night.  Are awake for no longer than 20 minutes if you do wake up during the night. Most adults need 7-8 hours of quality sleep each night. How can poor sleep affect me? If you do not get enough quality sleep, you may have:  Mood swings.  Daytime sleepiness.  Confusion.  Decreased reaction time.  Sleep disorders, such as insomnia and sleep apnea.  Difficulty with: ? Solving problems. ? Coping with stress. ? Paying attention. These issues may affect your performance and productivity at  work, school, and at home. Lack of sleep may also put you at higher risk for accidents, suicide, and risky behaviors. If you do not get quality sleep you may also be at higher risk for several health problems, including:  Infections.  Type 2 diabetes.  Heart disease.  High blood pressure.  Obesity.  Worsening of long-term conditions, like arthritis, kidney disease, depression, Parkinson's disease, and epilepsy. What actions can I take to get more quality sleep?      Stick to a sleep schedule. Go to sleep and wake up at about the same time each day. Do not try to sleep less on weekdays and make up for lost sleep on weekends. This does not work.  Try to get about 30 minutes of exercise on most days. Do not exercise 2-3 hours before going to bed.  Limit naps during the day to 30 minutes or less.  Do not use any products that contain nicotine or tobacco, such as cigarettes or e-cigarettes. If you need help quitting, ask your health care provider.  Do not drink caffeinated beverages for at least 8 hours before   going to bed. Coffee, tea, and some sodas contain caffeine.  Do not drink alcohol close to bedtime.  Do not eat large meals close to bedtime.  Do not take naps in the late afternoon.  Try to get at least 30 minutes of sunlight every day. Morning sunlight is best.  Make time to relax before bed. Reading, listening to music, or taking a hot bath promotes quality sleep.  Make your bedroom a place that promotes quality sleep. Keep your bedroom dark, quiet, and at a comfortable room temperature. Make sure your bed is comfortable. Take out sleep distractions like TV, a computer, smartphone, and bright lights.  If you are lying awake in bed for longer than 20 minutes, get up and do a relaxing activity until you feel sleepy.  Work with your health care provider to treat medical conditions that may affect sleeping, such as: ? Nasal obstruction. ? Snoring. ? Sleep apnea and other  sleep disorders.  Talk to your health care provider if you think any of your prescription medicines may cause you to have difficulty falling or staying asleep.  If you have sleep problems, talk with a sleep consultant. If you think you have a sleep disorder, talk with your health care provider about getting evaluated by a specialist. Where to find more information  National Sleep Foundation website: https://sleepfoundation.org  National Heart, Lung, and Blood Institute (NHLBI): www.nhlbi.nih.gov/files/docs/public/sleep/healthy_sleep.pdf  Centers for Disease Control and Prevention (CDC): www.cdc.gov/sleep/index.html Contact a health care provider if you:  Have trouble getting to sleep or staying asleep.  Often wake up very early in the morning and cannot get back to sleep.  Have daytime sleepiness.  Have daytime sleep attacks of suddenly falling asleep and sudden muscle weakness (narcolepsy).  Have a tingling sensation in your legs with a strong urge to move your legs (restless legs syndrome).  Stop breathing briefly during sleep (sleep apnea).  Think you have a sleep disorder or are taking a medicine that is affecting your quality of sleep. Summary  Most adults need 7-8 hours of quality sleep each night.  Getting enough quality sleep is an important part of health and well-being.  Make your bedroom a place that promotes quality sleep and avoid things that may cause you to have poor sleep, such as alcohol, caffeine, smoking, and large meals.  Talk to your health care provider if you have trouble falling asleep or staying asleep. This information is not intended to replace advice given to you by your health care provider. Make sure you discuss any questions you have with your health care provider. Document Revised: 02/24/2018 Document Reviewed: 02/24/2018 Elsevier Patient Education  2020 Elsevier Inc.  

## 2020-02-21 NOTE — Progress Notes (Signed)
SLEEP MEDICINE CLINIC    Provider:  Melvyn Novas, MD  Primary Care Physician:  Janeece Agee, NP [address] [place] [address]     Referring Provider: [facility].    Dr Helane Rima, DO      Chief Complaint according to patient   Patient presents with:    . New Patient (Initial Visit)     never had a SS. she describes sleeping through the night but not waking up feeling well rested. states 1-2 times a wk she wakes up with headaches.      HISTORY OF PRESENT ILLNESS:  Sharon Robertson is a 49 year old  Caucasian female patient and seen here upon  a referral on 02/21/2020 for a  Sleep medicine consultation.    Chief concern according to patient : I just have no restful restorative sleep.    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 She  has a past medical history of Asthma, Hyperlipemia, and Hypertension.. Super obesity, shortness of breath. She has received both Covid 19 vaccines.    Sleep relevant medical history: sleep walking in childhood, still in stressful situation-  No other Parasomnia  , adenoid- but not Tonsillectomy, she has looked into weight ,osss surgery but was psychologically not chosen.  Family medical /sleep history:  brother on CPAP with OSA.    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. She lives in a household alone.  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  Tobacco use; never .  ETOH use; never ,  Caffeine intake in form of Coffee( 1 cup I AM ) Soda( none ) Tea ( quit) , nor energy drinks. Regular exercise in form of walking/ swimming .   paradoxical response to melatonin =Insomnia.   Sleep habits are as follows:  The patient's dinner time is between 6 PM. The patient goes to bed at 9.30 PM and is mostly asleep by 10 PM, continues to sleep for 3  hours, wakes for one bathroom break, the first time at 1 AM.   The preferred sleep position is on her side, with the support of 2 pillows. No neck pain. Dreams are reportedly frequent/vivid- for the last 6 weeks. 5.30 AM is the usual rise time. The patient wakes up spontaneously- .  She reports not feeling refreshed or restored in AM, with symptoms such as dry mouth , morning headaches about twice a week- dull , right temple-not associated with  nausea. , and residual fatigue. Naps are taken seldomly . Power naps are refreshing, but hard to do.    Review of Systems: Out of a complete 14 system review, the patient complains of only the following symptoms, and all other reviewed systems are negative.:  Fatigue, sleepiness , snoring, fragmented sleep, Insomnia    How likely are you to doze in the following situations: 0 = not likely, 1 = slight chance, 2 = moderate chance, 3 = high chance   Sitting and Reading? Watching Television? Sitting inactive in a public place (theater or meeting)? As a passenger in a car for an hour without a break? Lying down in the afternoon when circumstances permit? Sitting and talking to someone? Sitting quietly after lunch without alcohol? In a car, while stopped for a few minutes in traffic?   Total =  10/ 24 points   FSS endorsed at 45/ 63 points. More fatigue, lack of rest.   Social History   Socioeconomic History  . Marital status: Single    Spouse name: Not on file  . Number of children: Not on file  . Years of education: Not on file  . Highest education level: Not on file  Occupational History  . Occupation: Designer, jewellery  Tobacco Use  . Smoking status: Never Smoker  . Smokeless tobacco: Never Used  Substance and Sexual Activity  . Alcohol use: Never  . Drug use: Never  . Sexual activity: Not on file  Other Topics Concern  . Not on file  Social History Narrative  . Not on file   Social Determinants of Health   Financial Resource  Strain: Low Risk   . Difficulty of Paying Living Expenses: Not hard at all  Food Insecurity: No Food Insecurity  . Worried About Programme researcher, broadcasting/film/video in the Last Year: Never true  . Ran Out of Food in the Last Year: Never true  Transportation Needs: No Transportation Needs  . Lack of Transportation (Medical): No  . Lack of Transportation (Non-Medical): No  Physical Activity: Insufficiently Active  . Days of Exercise per Week: 3 days  . Minutes of Exercise per Session: 30 min  Stress: No Stress Concern Present  . Feeling of Stress : Not at all  Social Connections: Unknown  . Frequency of Communication with Friends and Family: Three times a week  . Frequency of Social Gatherings with Friends and Family: Twice a week  . Attends Religious Services: Patient refused  . Active Member of Clubs or Organizations: Patient refused  . Attends Banker Meetings: Patient refused  . Marital Status: Patient refused    Family History  Problem Relation Age of Onset  . Diabetes Mother   . Heart disease Mother   . Hyperlipidemia Mother   . Hypertension Mother   . Obesity Mother   . Cancer Father        prostate  . Diabetes Father   . Hyperlipidemia Father   . Hypertension Father   . Obesity Father   . Heart disease Brother   . Heart disease Maternal Grandmother   . Diabetes Maternal Grandmother   . Heart disease Maternal Grandfather   . Heart disease Paternal Grandmother   . Heart disease Paternal Grandfather     Past Medical History:  Diagnosis Date  . Asthma   . Hyperlipemia   . Hypertension     Past Surgical History:  Procedure Laterality Date  . ADENOIDECTOMY     childhood     Current Outpatient Medications on File Prior to Visit  Medication Sig Dispense Refill  . atorvastatin (LIPITOR) 10 MG tablet Take 1 tablet (10 mg total) by mouth daily. 90 tablet 3  . cetirizine (ZYRTEC) 10 MG tablet Take 10 mg by mouth daily.    . chlorthalidone (HYGROTON) 25 MG tablet  TAKE 1/2 TABLET BY MOUTH EVERY DAY 90 tablet 3  . metoprolol succinate (TOPROL-XL) 25 MG 24 hr tablet TAKE 1 TABLET BY MOUTH EVERY DAY 30 tablet 1   No current facility-administered medications on file prior to visit.    No Known Allergies  Physical exam:  Today's Vitals   02/21/20 1433  BP: (!) 152/97  Pulse: 79  Temp: (!) 97.1 F (36.2 C)  Weight: (!) 413 lb (187.3 kg)  Height: 5\' 9"  (1.753 m)   Body mass index is 60.99  kg/m.   Wt Readings from Last 3 Encounters:  02/21/20 (!) 413 lb (187.3 kg)  02/09/20 (!) 410 lb (186 kg)  01/04/20 (!) 411 lb (186.4 kg)     Ht Readings from Last 3 Encounters:  02/21/20 5\' 9"  (1.753 m)  02/09/20 5\' 9"  (1.753 m)  01/04/20 5\' 9"  (1.753 m)      General: The patient is awake, alert and appears not in acute distress. The patient is well groomed. Head: Normocephalic, atraumatic. Neck is supple. Mallampati 3-4 , she has a short, sharp angled airway and lateral crowding,   neck circumference:17 inches . Nasal airflow patent.  Retrognathia is not seen.  Dental status: intact  Cardiovascular:   Regular rate and cardiac rhythm by pulse,  without distended neck veins. Respiratory: Lungs are clear to auscultation.  Skin:  Without evidence of ankle edema, or rash. Trunk: The patient's posture is erect.   Neurologic exam : The patient is awake and alert, oriented to place and time.   Memory subjective described as intact.  Attention span & concentration ability appears normal.  Speech is fluent,  without  dysarthria, dysphonia or aphasia.  Mood and affect are appropriate.   Cranial nerves: no loss of smell or taste reported  Pupils are equal and briskly reactive to light. Funduscopic exam deferred.  Extraocular movements in vertical and horizontal planes were intact and without nystagmus. No Diplopia. Visual fields by finger perimetry are intact. Hearing was intact to soft voice and finger rubbing.    Facial sensation intact to fine  touch.  Facial motor strength is symmetric and tongue and the short uvula move midline.  Neck ROM : rotation, tilt and flexion extension were normal for age and shoulder shrug was symmetrical.    Motor exam:  Symmetric bulk, tone and ROM.   Normal tone without cog- wheeling, symmetric grip strength . Sensory:  Fine touch and vibration were  Proprioception tested in the upper extremities was normal.  Coordination: Rapid alternating movements in the fingers/hands were of normal speed.  The Finger-to-nose maneuver was intact without evidence of ataxia, dysmetria or tremor. Gait and station: Patient could rise unassisted from a seated position, walked without assistive device.  Stance is of wider base .  Toe and heel walk were deferred.  Deep tendon reflexes: in the  upper and lower extremities are symmetrically atenuated  and intact.  Babinski response was deferred.       After spending a total time of 46 minutes face to face and additional time for physical and neurologic examination, review of laboratory studies,  personal review of imaging studies, reports and results of other testing and review of referral information / records as far as provided in visit, I have established the following assessments:  1) morning headaches, non restorative sleep, high degree of fatigue and elevated sleepiness .  2) loud snoring witnessed.  3) Super obesity- BMI 60.99 - this is her main risk factor ofr OSA with hypoventilation.    My Plan is to proceed with:  1) HST or attended sleep study- I prefer an attended study f due to sleep walking and hypoventilation risk in this patient.    I would like to thank Dr , DO , for allowing me to meet with and to take care of this pleasant patient.   In short, Sharon Robertson is presenting with lifelong struggle with obesity , cause of OSA, or obesity Hypoventilations.  I plan to follow up either personally or  through our NP within 2-3 month.    CC: I will share my notes with PCP.  Electronically signed by: Larey Seat, MD 02/21/2020 2:46 PM  [facility] Board certified by The [employer] of Sleep Medicine and Diplomate of the [employer] of Sleep Medicine. Board certified In Neurology through the [employer], Fellow of the [employer] of Neurology. Medical Director of [facility].

## 2020-02-23 ENCOUNTER — Other Ambulatory Visit: Payer: Self-pay

## 2020-02-23 ENCOUNTER — Encounter (INDEPENDENT_AMBULATORY_CARE_PROVIDER_SITE_OTHER): Payer: Self-pay | Admitting: Family Medicine

## 2020-02-23 ENCOUNTER — Ambulatory Visit (INDEPENDENT_AMBULATORY_CARE_PROVIDER_SITE_OTHER): Payer: BC Managed Care – PPO | Admitting: Family Medicine

## 2020-02-23 VITALS — BP 133/82 | HR 81 | Temp 97.5°F | Ht 69.0 in | Wt >= 6400 oz

## 2020-02-23 DIAGNOSIS — I10 Essential (primary) hypertension: Secondary | ICD-10-CM

## 2020-02-23 DIAGNOSIS — E559 Vitamin D deficiency, unspecified: Secondary | ICD-10-CM

## 2020-02-23 DIAGNOSIS — Z9189 Other specified personal risk factors, not elsewhere classified: Secondary | ICD-10-CM | POA: Diagnosis not present

## 2020-02-23 DIAGNOSIS — E7849 Other hyperlipidemia: Secondary | ICD-10-CM

## 2020-02-23 DIAGNOSIS — R7303 Prediabetes: Secondary | ICD-10-CM

## 2020-02-23 DIAGNOSIS — Z6841 Body Mass Index (BMI) 40.0 and over, adult: Secondary | ICD-10-CM

## 2020-02-26 ENCOUNTER — Other Ambulatory Visit: Payer: Self-pay | Admitting: Registered Nurse

## 2020-02-26 DIAGNOSIS — I10 Essential (primary) hypertension: Secondary | ICD-10-CM

## 2020-02-27 ENCOUNTER — Encounter (INDEPENDENT_AMBULATORY_CARE_PROVIDER_SITE_OTHER): Payer: Self-pay | Admitting: Family Medicine

## 2020-02-27 MED ORDER — VITAMIN D (ERGOCALCIFEROL) 1.25 MG (50000 UNIT) PO CAPS: 50000.0000 [IU] | ORAL_CAPSULE | ORAL | 0 refills | Status: DC

## 2020-02-27 MED ORDER — METFORMIN HCL 500 MG PO TABS: 500.0000 mg | ORAL_TABLET | Freq: Every day | ORAL | 0 refills | Status: DC

## 2020-02-27 NOTE — Progress Notes (Signed)
Chief Complaint:   OBESITY Sharon Robertson is here to discuss her progress with her obesity treatment plan along with follow-up of her obesity related diagnoses. Sharon Robertson is on the Category 4 Plan and states she is following her eating plan approximately 95% of the time. Sharon Robertson states she is walking for 30 minutes 3 times per week.  Today's visit was #: 2 Starting weight: 410 lbs Starting date: 02/09/2020 Today's weight: 408 lbs Today's date: 02/23/2020 Total lbs lost to date: 2 lbs Total lbs lost since last in-office visit: 2 lbs  Interim History: Sharon Robertson is able to follow the plan well.  She reports having PMS last week and was hungry about an hour after dinner.  She is drinking plenty of water and walking.  She says she feels more energetic and is in a better mood.  She says her sleep is better.  Subjective:   1. Prediabetes Sharon Robertson has a diagnosis of prediabetes based on her elevated HgA1c and was informed this puts her at greater risk of developing diabetes. She continues to work on diet and exercise to decrease her risk of diabetes. She denies nausea or hypoglycemia.  Lab Results  Component Value Date   HGBA1C 5.9 (H) 01/04/2020   2. Other hyperlipidemia Sharon Robertson has hyperlipidemia and has been trying to improve her cholesterol levels with intensive lifestyle modification including a low saturated fat diet, exercise and weight loss. She denies any chest pain, claudication or myalgias.  She is taking Lipitor.  Lab Results  Component Value Date   ALT 25 07/05/2019   AST 17 07/05/2019   ALKPHOS 82 07/05/2019   BILITOT 0.3 07/05/2019   Lab Results  Component Value Date   CHOL 202 (H) 01/04/2020   HDL 48 01/04/2020   LDLCALC 132 (H) 01/04/2020   TRIG 124 01/04/2020   CHOLHDL 4.2 01/04/2020   3. Essential hypertension Review: taking medications as instructed, no medication side effects noted, no chest pain on exertion, no dyspnea on exertion, no swelling of ankles.  She takes  chlorthalidone and Toprol for blood pressure.  BP Readings from Last 3 Encounters:  02/23/20 133/82  02/21/20 (!) 152/97  02/09/20 110/73   4. Vitamin D deficiency Sharon Robertson's Vitamin D level was 20.6 on 02/09/2020. She is currently taking prescription vitamin D 50,000 IU each week. She denies nausea, vomiting or muscle weakness.  5. At risk for heart disease Sharon Robertson is at a higher than average risk for cardiovascular disease due to obesity.   Assessment/Plan:   1. Prediabetes Akia will continue to work on weight loss, exercise, and decreasing simple carbohydrates to help decrease the risk of diabetes.   Orders - metFORMIN (GLUCOPHAGE) 500 MG tablet; Take 1 tablet (500 mg total) by mouth daily with breakfast.  Dispense: 30 tablet; Refill: 0  2. Other hyperlipidemia Cardiovascular risk and specific lipid/LDL goals reviewed.  We discussed several lifestyle modifications today and Sharon Robertson will continue to work on diet, exercise and weight loss efforts. Orders and follow up as documented in patient record.   Counseling Intensive lifestyle modifications are the first line treatment for this issue. . Dietary changes: Increase soluble fiber. Decrease simple carbohydrates. . Exercise changes: Moderate to vigorous-intensity aerobic activity 150 minutes per week if tolerated. . Lipid-lowering medications: see documented in medical record.  3. Essential hypertension Sharon Robertson is working on healthy weight loss and exercise to improve blood pressure control. We will watch for signs of hypotension as she continues her lifestyle modifications.  4. Vitamin D deficiency  Low Vitamin D level contributes to fatigue and are associated with obesity, breast, and colon cancer. She agrees to continue to take prescription Vitamin D @50 ,000 IU every week and will follow-up for routine testing of Vitamin D, at least 2-3 times per year to avoid over-replacement.  Orders - Vitamin D, Ergocalciferol, (DRISDOL) 1.25 MG  (50000 UNIT) CAPS capsule; Take 1 capsule (50,000 Units total) by mouth every 7 (seven) days.  Dispense: 4 capsule; Refill: 0  5. At risk for heart disease Sharon Robertson was given approximately 15 minutes of coronary artery disease prevention counseling today. She is 49 y.o. female and has risk factors for heart disease including obesity. We discussed intensive lifestyle modifications today with an emphasis on specific weight loss instructions and strategies.   Repetitive spaced learning was employed today to elicit superior memory formation and behavioral change.  6. Class 3 severe obesity with serious comorbidity and body mass index (BMI) of 60.0 to 69.9 in adult, unspecified obesity type (HCC) Sharon Robertson is currently in the action stage of change. As such, her goal is to continue with weight loss efforts. She has agreed to the Category 4 Plan.   Exercise goals: As is.  Zumba.  Behavioral modification strategies: increasing water intake.  Sharon Robertson has agreed to follow-up with our clinic in 2 weeks. She was informed of the importance of frequent follow-up visits to maximize her success with intensive lifestyle modifications for her multiple health conditions.   Objective:   Blood pressure 133/82, pulse 81, temperature (!) 97.5 F (36.4 C), temperature source Oral, height 5\' 9"  (1.753 m), weight (!) 408 lb (185.1 kg), last menstrual period 02/18/2020, SpO2 97 %. Body mass index is 60.25 kg/m.  General: Cooperative, alert, well developed, in no acute distress. HEENT: Conjunctivae and lids unremarkable. Cardiovascular: Regular rhythm.  Lungs: Normal work of breathing. Neurologic: No focal deficits.   Lab Results  Component Value Date   CREATININE 0.65 07/05/2019   BUN 13 07/05/2019   NA 137 07/05/2019   K 4.5 07/05/2019   CL 100 07/05/2019   CO2 23 07/05/2019   Lab Results  Component Value Date   ALT 25 07/05/2019   AST 17 07/05/2019   ALKPHOS 82 07/05/2019   BILITOT 0.3 07/05/2019   Lab  Results  Component Value Date   HGBA1C 5.9 (H) 01/04/2020   HGBA1C 5.9 (H) 07/05/2019   HGBA1C 5.6 03/20/2018   Lab Results  Component Value Date   TSH 1.990 07/05/2019   Lab Results  Component Value Date   CHOL 202 (H) 01/04/2020   HDL 48 01/04/2020   LDLCALC 132 (H) 01/04/2020   TRIG 124 01/04/2020   CHOLHDL 4.2 01/04/2020   Lab Results  Component Value Date   WBC 9.7 01/04/2020   HGB 12.5 01/04/2020   HCT 39.0 02/09/2020   MCV 77 (L) 01/04/2020   PLT 465 (H) 01/04/2020   Lab Results  Component Value Date   IRON 93 02/09/2020   TIBC 363 02/09/2020   FERRITIN 42 02/09/2020   Attestation Statements:   Reviewed by clinician on day of visit: allergies, medications, problem list, medical history, surgical history, family history, social history, and previous encounter notes.  I, 04/10/2020, CMA, am acting as 04/10/2020 for Insurance claims handler, DO.  I have reviewed the above documentation for accuracy and completeness, and I agree with the above. Energy manager, DO

## 2020-02-28 ENCOUNTER — Telehealth: Payer: Self-pay

## 2020-02-28 NOTE — Telephone Encounter (Signed)
LVM for patient to call me back to schedule sleep study 

## 2020-03-06 ENCOUNTER — Ambulatory Visit (INDEPENDENT_AMBULATORY_CARE_PROVIDER_SITE_OTHER): Payer: BC Managed Care – PPO | Admitting: Neurology

## 2020-03-06 DIAGNOSIS — E785 Hyperlipidemia, unspecified: Secondary | ICD-10-CM

## 2020-03-06 DIAGNOSIS — R519 Headache, unspecified: Secondary | ICD-10-CM

## 2020-03-06 DIAGNOSIS — R0683 Snoring: Secondary | ICD-10-CM

## 2020-03-06 DIAGNOSIS — Z6841 Body Mass Index (BMI) 40.0 and over, adult: Secondary | ICD-10-CM

## 2020-03-06 DIAGNOSIS — G4733 Obstructive sleep apnea (adult) (pediatric): Secondary | ICD-10-CM | POA: Diagnosis not present

## 2020-03-06 DIAGNOSIS — I10 Essential (primary) hypertension: Secondary | ICD-10-CM

## 2020-03-06 DIAGNOSIS — G4761 Periodic limb movement disorder: Secondary | ICD-10-CM

## 2020-03-14 ENCOUNTER — Encounter (INDEPENDENT_AMBULATORY_CARE_PROVIDER_SITE_OTHER): Payer: Self-pay | Admitting: Family Medicine

## 2020-03-14 ENCOUNTER — Ambulatory Visit (INDEPENDENT_AMBULATORY_CARE_PROVIDER_SITE_OTHER): Payer: BC Managed Care – PPO | Admitting: Family Medicine

## 2020-03-14 ENCOUNTER — Other Ambulatory Visit: Payer: Self-pay

## 2020-03-14 VITALS — BP 124/81 | HR 83 | Temp 97.9°F | Ht 69.0 in | Wt >= 6400 oz

## 2020-03-14 DIAGNOSIS — Z9189 Other specified personal risk factors, not elsewhere classified: Secondary | ICD-10-CM

## 2020-03-14 DIAGNOSIS — E559 Vitamin D deficiency, unspecified: Secondary | ICD-10-CM | POA: Diagnosis not present

## 2020-03-14 DIAGNOSIS — E7849 Other hyperlipidemia: Secondary | ICD-10-CM

## 2020-03-14 DIAGNOSIS — R7303 Prediabetes: Secondary | ICD-10-CM | POA: Diagnosis not present

## 2020-03-14 DIAGNOSIS — I10 Essential (primary) hypertension: Secondary | ICD-10-CM | POA: Diagnosis not present

## 2020-03-14 DIAGNOSIS — Z6841 Body Mass Index (BMI) 40.0 and over, adult: Secondary | ICD-10-CM

## 2020-03-14 MED ORDER — VITAMIN D (ERGOCALCIFEROL) 1.25 MG (50000 UNIT) PO CAPS: 50000.0000 [IU] | ORAL_CAPSULE | ORAL | 0 refills | Status: DC

## 2020-03-14 MED ORDER — METFORMIN HCL 500 MG PO TABS: 500.0000 mg | ORAL_TABLET | Freq: Every day | ORAL | 0 refills | Status: DC

## 2020-03-14 NOTE — Progress Notes (Signed)
Chief Complaint:   OBESITY Sharon Robertson is here to discuss her progress with her obesity treatment plan along with follow-up of her obesity related diagnoses. Sharon Robertson is on the Category 4 Plan and states she is following her eating plan approximately 100% of the time. Sharon Robertson states she is walking for 45 minutes 4 times per week.  Today's visit was #: 3 Starting weight: 410 lbs Starting date: 02/09/2020 Today's weight: 403 lbs Today's date: 03/14/2020 Total lbs lost to date: 7 lbs Total lbs lost since last in-office visit: 5 lbs  Interim History: Sharon Robertson is doing well with the Category 4 Plan.  She is working on mindful planning/eating.  She says she feels that metformin has been helpful.  She has increased her pace when walking and says she feels more energetic.  Subjective:   1. Vitamin D deficiency Sharon Robertson's Vitamin D level was 20.6 on 02/09/2020. She is currently taking prescription vitamin D 50,000 IU each week. She denies nausea, vomiting or muscle weakness.  2. Essential hypertension Review: taking medications as instructed, no medication side effects noted, no chest pain on exertion, no dyspnea on exertion, no swelling of ankles.   BP Readings from Last 3 Encounters:  03/14/20 124/81  02/23/20 133/82  02/21/20 (!) 152/97   3. Other hyperlipidemia Sharon Robertson has hyperlipidemia and has been trying to improve her cholesterol levels with intensive lifestyle modification including a low saturated fat diet, exercise and weight loss. She denies any chest pain, claudication or myalgias.  Lab Results  Component Value Date   ALT 25 07/05/2019   AST 17 07/05/2019   ALKPHOS 82 07/05/2019   BILITOT 0.3 07/05/2019   Lab Results  Component Value Date   CHOL 202 (H) 01/04/2020   HDL 48 01/04/2020   LDLCALC 132 (H) 01/04/2020   TRIG 124 01/04/2020   CHOLHDL 4.2 01/04/2020   4. Prediabetes Sharon Robertson has a diagnosis of prediabetes based on her elevated HgA1c and was informed this puts her at greater  risk of developing diabetes. She continues to work on diet and exercise to decrease her risk of diabetes. She denies nausea or hypoglycemia.  She is taking metformin 500 mg daily.  She reports that she has slowed down her eating.  She is eager to plan meals.  Denies polyphagia and cravings now.  Lab Results  Component Value Date   HGBA1C 5.9 (H) 01/04/2020   5. At risk for heart disease Sharon Robertson is at a higher than average risk for cardiovascular disease due to obesity.   Assessment/Plan:   1. Vitamin D deficiency Low Vitamin D level contributes to fatigue and are associated with obesity, breast, and colon cancer. She agrees to continue to take prescription Vitamin D @50 ,000 IU every week and will follow-up for routine testing of Vitamin D, at least 2-3 times per year to avoid over-replacement.  Orders - Vitamin D, Ergocalciferol, (DRISDOL) 1.25 MG (50000 UNIT) CAPS capsule; Take 1 capsule (50,000 Units total) by mouth every 7 (seven) days.  Dispense: 4 capsule; Refill: 0  2. Essential hypertension Sharon Robertson is working on healthy weight loss and exercise to improve blood pressure control. We will watch for signs of hypotension as she continues her lifestyle modifications.  3. Other hyperlipidemia Cardiovascular risk and specific lipid/LDL goals reviewed.  We discussed several lifestyle modifications today and Sharon Robertson will continue to work on diet, exercise and weight loss efforts. Orders and follow up as documented in patient record.   Counseling Intensive lifestyle modifications are the first line  treatment for this issue. . Dietary changes: Increase soluble fiber. Decrease simple carbohydrates. . Exercise changes: Moderate to vigorous-intensity aerobic activity 150 minutes per week if tolerated. . Lipid-lowering medications: see documented in medical record.  4. Prediabetes Sharon Robertson will continue to work on weight loss, exercise, and decreasing simple carbohydrates to help decrease the risk of  diabetes.  - metFORMIN (GLUCOPHAGE) 500 MG tablet; Take 1 tablet (500 mg total) by mouth daily with breakfast.  Dispense: 30 tablet; Refill: 0  5. At risk for heart disease Sharon Robertson was given approximately 15 minutes of coronary artery disease prevention counseling today. She is 49 y.o. female and has risk factors for heart disease including obesity. We discussed intensive lifestyle modifications today with an emphasis on specific weight loss instructions and strategies.   Repetitive spaced learning was employed today to elicit superior memory formation and behavioral change.  6. Class 3 severe obesity with serious comorbidity and body mass index (BMI) of 50.0 to 59.9 in adult, unspecified obesity type (HCC) Sharon Robertson is currently in the action stage of change. As such, her goal is to continue with weight loss efforts. She has agreed to the Category 4 Plan.   Exercise goals: For substantial health benefits, adults should do at least 150 minutes (2 hours and 30 minutes) a week of moderate-intensity, or 75 minutes (1 hour and 15 minutes) a week of vigorous-intensity aerobic physical activity, or an equivalent combination of moderate- and vigorous-intensity aerobic activity. Aerobic activity should be performed in episodes of at least 10 minutes, and preferably, it should be spread throughout the week.  Behavioral modification strategies: increasing lean protein intake and increasing water intake.  Sharon Robertson has agreed to follow-up with our clinic in 2 weeks. She was informed of the importance of frequent follow-up visits to maximize her success with intensive lifestyle modifications for her multiple health conditions.   Objective:   Blood pressure 124/81, pulse 83, temperature 97.9 F (36.6 C), temperature source Oral, height 5\' 9"  (1.753 m), weight (!) 403 lb (182.8 kg), last menstrual period 02/18/2020, SpO2 97 %. Body mass index is 59.51 kg/m.  General: Cooperative, alert, well developed, in no acute  distress. HEENT: Conjunctivae and lids unremarkable. Cardiovascular: Regular rhythm.  Lungs: Normal work of breathing. Neurologic: No focal deficits.   Lab Results  Component Value Date   CREATININE 0.65 07/05/2019   BUN 13 07/05/2019   NA 137 07/05/2019   K 4.5 07/05/2019   CL 100 07/05/2019   CO2 23 07/05/2019   Lab Results  Component Value Date   ALT 25 07/05/2019   AST 17 07/05/2019   ALKPHOS 82 07/05/2019   BILITOT 0.3 07/05/2019   Lab Results  Component Value Date   HGBA1C 5.9 (H) 01/04/2020   HGBA1C 5.9 (H) 07/05/2019   HGBA1C 5.6 03/20/2018   Lab Results  Component Value Date   TSH 1.990 07/05/2019   Lab Results  Component Value Date   CHOL 202 (H) 01/04/2020   HDL 48 01/04/2020   LDLCALC 132 (H) 01/04/2020   TRIG 124 01/04/2020   CHOLHDL 4.2 01/04/2020   Lab Results  Component Value Date   WBC 9.7 01/04/2020   HGB 12.5 01/04/2020   HCT 39.0 02/09/2020   MCV 77 (L) 01/04/2020   PLT 465 (H) 01/04/2020   Lab Results  Component Value Date   IRON 93 02/09/2020   TIBC 363 02/09/2020   FERRITIN 42 02/09/2020   Attestation Statements:   Reviewed by clinician on day of visit: allergies,  medications, problem list, medical history, surgical history, family history, social history, and previous encounter notes.  I, Insurance claims handler, CMA, am acting as Energy manager for [employer], DO.  I have reviewed the above documentation for accuracy and completeness, and I agree with the above. Helane Rima, DO

## 2020-03-18 ENCOUNTER — Other Ambulatory Visit (INDEPENDENT_AMBULATORY_CARE_PROVIDER_SITE_OTHER): Payer: Self-pay | Admitting: Family Medicine

## 2020-03-18 DIAGNOSIS — E559 Vitamin D deficiency, unspecified: Secondary | ICD-10-CM

## 2020-03-19 MED ORDER — ROPINIROLE HCL 0.25 MG PO TABS: 0.2500 mg | ORAL_TABLET | Freq: Every day | ORAL | 3 refills | Status: DC

## 2020-03-19 NOTE — Procedures (Signed)
PATIENT'S NAME:  Sharon, Robertson DOB:      [birth year]      MR#:    [id number]     DATE OF RECORDING: 03/06/2020 REFERRING M.D.:  Sharon Agee, NP Study Performed:   Baseline Polysomnogram HISTORY:  This 49 year- old Caucasian female patient was seen on 02/21/2020 for a Sleep medicine consultation. Chief concern according to patient: I just have no restful/ restorative sleep.  Sharon Robertson has a past medical history of Asthma, Hyperlipemia, and Hypertension. Super obesity, shortness of breath. She has received both Covid 19 vaccines.  Sleep relevant medical history: sleep walking in childhood, still in stressful situation- No other Parasomnia, had an Adenoid- but not Tonsillectomy. She has looked into weight loss surgery. Has a brother on CPAP with OSA.  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. She lives in a household alone.  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  paradoxical response to melatonin =Insomnia.  The patient endorsed the Epworth Sleepiness Scale at 10 points.   The patient's weight 412 pounds with a height of 69 (inches), resulting in a BMI of 61.1 kg/m2.  The patient's neck circumference measured 17 inches.  CURRENT MEDICATIONS: Lipitor, Zyrtec, Hygroton, Toprol   PROCEDURE:  This is a multichannel digital polysomnogram utilizing the Somnostar 11.2 system.  Electrodes and sensors were applied and monitored per AASM Specifications.   EEG, EOG, Chin and Limb EMG, were sampled at 200 Hz.  ECG, Snore and Nasal Pressure, Thermal Airflow, Respiratory Effort, CPAP Flow and Pressure, Oximetry was sampled at 50 Hz. Digital video and audio were recorded.      BASELINE STUDY: Lights Out was at 21:36 and Lights On at 05:00.  Total recording time (TRT) was 444 minutes, with a total sleep time (TST) of 251 minutes.   The patient's sleep latency was 103 minutes.  REM latency was 165.5  minutes.  The sleep efficiency was 56.5 %.     SLEEP ARCHITECTURE: WASO (Wake after sleep onset) was 141 minutes.  There were 54 minutes in Stage N1, 71 minutes Stage N2, 74 minutes Stage N3 and 52 minutes in Stage REM.  The percentage of Stage N1 was 21.5%, Stage N2 was 28.3%, Stage N3 was 29.5% and Stage R (REM sleep) was 20.7%.   RESPIRATORY ANALYSIS:  There were a total of 54 respiratory events:  3 obstructive apneas, 1 central apnea and 0 mixed apneas with a total of 4 apneas and an apnea index (AI) of 1.0 /hour. There were 50 hypopneas with a hypopnea index of 12.0 /hour.      The total APNEA/HYPOPNEA INDEX (AHI) was 12.9/hour.  12 events occurred in REM sleep and 77 events in NREM. The REM AHI was  13.8 /hour, versus a non-REM AHI of 12.7. The patient spent 12 minutes of total sleep time in the supine position and 239 minutes in non-supine. The supine AHI was 55.0 versus a non-supine AHI of 10.8/h.  OXYGEN SATURATION & C02:  The Wake baseline 02 saturation was 98%, with the lowest being 82%. Time spent below 89% saturation equaled 4 minutes.  The arousals were noted as: 103 were spontaneous, 47 were associated with PLMs, 31 were associated with respiratory events. The patient had a total of 71 Periodic Limb Movements.  The Periodic Limb Movement (PLM) index was 17. and the PLM Arousal index was 11.2/hour. Audio and video analysis did not show  any abnormal or unusual movements, behaviors, phonations or vocalizations.  The patient was restless, had normal EEG and EKG.   IMPRESSION:  1. Obstructive Sleep Apnea (OSA), at AHI 13/h with strong supine exacerbation.  2. Periodic Limb Movement Disorder (PLMD)   RECOMMENDATIONS:  1. Advise full-night, attended, CPAP titration study to optimize therapy.  2. Avoid sleeping supine. 3. Medication treatment for PLMs is recommended,    I certify that I have reviewed the entire raw data recording prior to the issuance of this report in accordance  with the Standards of Accreditation of the American Academy of Sleep Medicine (AASM)      Sharon Seat, MD Diplomat, American Board of Psychiatry and Neurology  Diplomat, American Board of Sleep Medicine Market researcher, [facility] [employer]

## 2020-03-19 NOTE — Progress Notes (Signed)
IMPRESSION:   1. Obstructive Sleep Apnea (OSA), at AHI 13/h with strong supine  exacerbation.  2. Periodic Limb Movement Disorder (PLMD)    RECOMMENDATIONS:   1. Advise full-night, attended, CPAP titration study to optimize  therapy.  2. Avoid sleeping supine.  3. Medication treatment for PLMs is recommended,

## 2020-03-19 NOTE — Addendum Note (Signed)
Addended by: Melvyn Novas on: 03/19/2020 01:08 PM   Modules accepted: Orders

## 2020-03-21 ENCOUNTER — Telehealth: Payer: Self-pay

## 2020-03-21 NOTE — Telephone Encounter (Signed)
-----   Message from Casey C Bruno, RN sent at 03/19/2020  1:29 PM EDT ----- Titration study ----- Message ----- From: Dohmeier, Carmen, MD Sent: 03/19/2020   1:08 PM EDT To: Casey C Bruno, RN, Erica Wallace, DO  IMPRESSION:   1. Obstructive Sleep Apnea (OSA), at AHI 13/h with strong supine  exacerbation.  2. Periodic Limb Movement Disorder (PLMD)    RECOMMENDATIONS:   1. Advise full-night, attended, CPAP titration study to optimize  therapy.  2. Avoid sleeping supine.  3. Medication treatment for PLMs is recommended,   

## 2020-03-21 NOTE — Telephone Encounter (Signed)
Called pt to discuss results of sleep study and to schedule CPAP titration study.

## 2020-03-22 ENCOUNTER — Telehealth: Payer: Self-pay

## 2020-03-22 NOTE — Telephone Encounter (Signed)
I called pt. I advised pt that Dr. Vickey Huger reviewed their sleep study results and found that the sleep study showed Obstructive Sleep Apnea (OSA), at AHI 13/h with strong supine  Exacerbation and Periodic Limb Movement Disorder (PLMD) and recommends that pt be treated with a cpap. Dr. Vickey Huger recommends that pt return for a repeat sleep study in order to properly titrate the cpap and ensure a good mask fit. Pt is agreeable to returning for a titration study. I advised pt that our sleep lab will file with pt's insurance and call pt to schedule the sleep study when we hear back from the pt's insurance regarding coverage of this sleep study. Pt verbalized understanding of results. Pt had no questions at this time but was encouraged to call back if questions arise.  Pt is scheduled for CPAP study on May 6 at 8pm and Covid test on 04/02/2020.

## 2020-03-22 NOTE — Telephone Encounter (Signed)
-----   Message from Judi Cong, RN sent at 03/19/2020  1:29 PM EDT ----- Titration study ----- Message ----- From: Melvyn Novas, MD Sent: 03/19/2020   1:08 PM EDT To: Judi Cong, RN, Helane Rima, DO  IMPRESSION:   1. Obstructive Sleep Apnea (OSA), at AHI 13/h with strong supine  exacerbation.  2. Periodic Limb Movement Disorder (PLMD)    RECOMMENDATIONS:   1. Advise full-night, attended, CPAP titration study to optimize  therapy.  2. Avoid sleeping supine.  3. Medication treatment for PLMs is recommended,

## 2020-04-02 ENCOUNTER — Other Ambulatory Visit (HOSPITAL_COMMUNITY)
Admission: RE | Admit: 2020-04-02 | Discharge: 2020-04-02 | Disposition: A | Discharge status: Home / Self Care | Payer: BC Managed Care – PPO | Source: Ambulatory Visit | Attending: Neurology | Admitting: Neurology

## 2020-04-02 DIAGNOSIS — Z20822 Contact with and (suspected) exposure to covid-19: Secondary | ICD-10-CM | POA: Insufficient documentation

## 2020-04-02 DIAGNOSIS — Z01812 Encounter for preprocedural laboratory examination: Secondary | ICD-10-CM | POA: Diagnosis present

## 2020-04-02 LAB — SARS CORONAVIRUS 2 (TAT 6-24 HRS): SARS Coronavirus 2: NEGATIVE

## 2020-04-05 ENCOUNTER — Encounter (INDEPENDENT_AMBULATORY_CARE_PROVIDER_SITE_OTHER): Payer: Self-pay | Admitting: Family Medicine

## 2020-04-05 ENCOUNTER — Other Ambulatory Visit: Payer: Self-pay

## 2020-04-05 ENCOUNTER — Ambulatory Visit (INDEPENDENT_AMBULATORY_CARE_PROVIDER_SITE_OTHER): Payer: BC Managed Care – PPO | Admitting: Neurology

## 2020-04-05 ENCOUNTER — Ambulatory Visit (INDEPENDENT_AMBULATORY_CARE_PROVIDER_SITE_OTHER): Payer: BC Managed Care – PPO | Admitting: Family Medicine

## 2020-04-05 VITALS — BP 115/74 | HR 70 | Temp 97.7°F | Ht 69.0 in | Wt 397.0 lb

## 2020-04-05 DIAGNOSIS — G4733 Obstructive sleep apnea (adult) (pediatric): Secondary | ICD-10-CM | POA: Diagnosis not present

## 2020-04-05 DIAGNOSIS — I10 Essential (primary) hypertension: Secondary | ICD-10-CM | POA: Diagnosis not present

## 2020-04-05 DIAGNOSIS — E559 Vitamin D deficiency, unspecified: Secondary | ICD-10-CM | POA: Diagnosis not present

## 2020-04-05 DIAGNOSIS — R0683 Snoring: Secondary | ICD-10-CM

## 2020-04-05 DIAGNOSIS — G4761 Periodic limb movement disorder: Secondary | ICD-10-CM

## 2020-04-05 DIAGNOSIS — R7303 Prediabetes: Secondary | ICD-10-CM | POA: Diagnosis not present

## 2020-04-05 DIAGNOSIS — R519 Headache, unspecified: Secondary | ICD-10-CM

## 2020-04-05 DIAGNOSIS — Z6841 Body Mass Index (BMI) 40.0 and over, adult: Secondary | ICD-10-CM

## 2020-04-05 DIAGNOSIS — E785 Hyperlipidemia, unspecified: Secondary | ICD-10-CM

## 2020-04-09 NOTE — Progress Notes (Signed)
Chief Complaint:   OBESITY Sharon Robertson is here to discuss her progress with her obesity treatment plan along with follow-up of her obesity related diagnoses. Sharon Robertson is on the Category 4 Plan and states she is following her eating plan approximately 100% of the time. Sharon Robertson states she is walking for 30 minutes 4 times per week.  Today's visit was #: 4 Starting weight: 410 lbs Starting date: 02/09/2020 Today's weight: 397 lbs Today's date: 04/05/2020 Total lbs lost to date: 13 lbs Total lbs lost since last in-office visit: 6 lbs  Interim History: Sharon Robertson is getting fitted for a CPAP tonight.  She has RLS and is now taking Requip with relief of symptoms.  Subjective:   1. Prediabetes Sharon Robertson has a diagnosis of prediabetes based on her elevated HgA1c and was informed this puts her at greater risk of developing diabetes. She continues to work on diet and exercise to decrease her risk of diabetes. She denies nausea or hypoglycemia.  Lab Results  Component Value Date   HGBA1C 5.9 (H) 01/04/2020   2. Vitamin D deficiency Sharon Robertson's Vitamin D level was 20.6 on 02/09/2020. She is currently taking prescription vitamin D 50,000 IU each week. She denies nausea, vomiting or muscle weakness.  3. Essential hypertension Review: taking medications as instructed, no medication side effects noted, no chest pain on exertion, no swelling of ankles.   BP Readings from Last 3 Encounters:  04/05/20 115/74  03/14/20 124/81  02/23/20 133/82   Assessment/Plan:   1. Prediabetes Shamila will continue to work on weight loss, exercise, and decreasing simple carbohydrates to help decrease the risk of diabetes.   Orders - metFORMIN (GLUCOPHAGE) 500 MG tablet; Take 1 tablet (500 mg total) by mouth daily with breakfast.  Dispense: 30 tablet; Refill: 0  2. Vitamin D deficiency Low Vitamin D level contributes to fatigue and are associated with obesity, breast, and colon cancer. She agrees to continue to take prescription  Vitamin D @50 ,000 IU every week and will follow-up for routine testing of Vitamin D, at least 2-3 times per year to avoid over-replacement.  Orders - Vitamin D, Ergocalciferol, (DRISDOL) 1.25 MG (50000 UNIT) CAPS capsule; Take 1 capsule (50,000 Units total) by mouth every 7 (seven) days.  Dispense: 4 capsule; Refill: 0  3. Essential hypertension Sharon Robertson is working on healthy weight loss and exercise to improve blood pressure control. We will watch for signs of hypotension as she continues her lifestyle modifications.  Will start lisinopril 10 mg daily, as per below.  Orders - lisinopril (ZESTRIL) 10 MG tablet; Take 1 tablet (10 mg total) by mouth daily.  Dispense: 30 tablet; Refill: 0  4. Class 3 severe obesity with serious comorbidity and body mass index (BMI) of 50.0 to 59.9 in adult, unspecified obesity type (HCC) Sharon Robertson is currently in the action stage of change. As such, her goal is to continue with weight loss efforts. She has agreed to the Category 4 Plan.   Exercise goals: For substantial health benefits, adults should do at least 150 minutes (2 hours and 30 minutes) a week of moderate-intensity, or 75 minutes (1 hour and 15 minutes) a week of vigorous-intensity aerobic physical activity, or an equivalent combination of moderate- and vigorous-intensity aerobic activity. Aerobic activity should be performed in episodes of at least 10 minutes, and preferably, it should be spread throughout the week.  Behavioral modification strategies: increasing lean protein intake and increasing water intake.  Sharon Robertson has agreed to follow-up with our clinic in 2 weeks.  She was informed of the importance of frequent follow-up visits to maximize her success with intensive lifestyle modifications for her multiple health conditions.   Objective:   Blood pressure 115/74, pulse 70, temperature 97.7 F (36.5 C), temperature source Oral, height 5\' 9"  (1.753 m), weight (!) 397 lb (180.1 kg), last menstrual period  03/14/2020, SpO2 97 %. Body mass index is 58.63 kg/m.  General: Cooperative, alert, well developed, in no acute distress. HEENT: Conjunctivae and lids unremarkable. Cardiovascular: Regular rhythm.  Lungs: Normal work of breathing. Neurologic: No focal deficits.   Lab Results  Component Value Date   CREATININE 0.65 07/05/2019   BUN 13 07/05/2019   NA 137 07/05/2019   K 4.5 07/05/2019   CL 100 07/05/2019   CO2 23 07/05/2019   Lab Results  Component Value Date   ALT 25 07/05/2019   AST 17 07/05/2019   ALKPHOS 82 07/05/2019   BILITOT 0.3 07/05/2019   Lab Results  Component Value Date   HGBA1C 5.9 (H) 01/04/2020   HGBA1C 5.9 (H) 07/05/2019   HGBA1C 5.6 03/20/2018   Lab Results  Component Value Date   TSH 1.990 07/05/2019   Lab Results  Component Value Date   CHOL 202 (H) 01/04/2020   HDL 48 01/04/2020   LDLCALC 132 (H) 01/04/2020   TRIG 124 01/04/2020   CHOLHDL 4.2 01/04/2020   Lab Results  Component Value Date   WBC 9.7 01/04/2020   HGB 12.5 01/04/2020   HCT 39.0 02/09/2020   MCV 77 (L) 01/04/2020   PLT 465 (H) 01/04/2020   Lab Results  Component Value Date   IRON 93 02/09/2020   TIBC 363 02/09/2020   FERRITIN 42 02/09/2020   Attestation Statements:   Reviewed by clinician on day of visit: allergies, medications, problem list, medical history, surgical history, family history, social history, and previous encounter notes.  I, 04/10/2020, CMA, am acting as Insurance claims handler for Energy manager, DO.  I have reviewed the above documentation for accuracy and completeness, and I agree with the above. [employer], DO

## 2020-04-10 ENCOUNTER — Encounter (INDEPENDENT_AMBULATORY_CARE_PROVIDER_SITE_OTHER): Payer: Self-pay | Admitting: Family Medicine

## 2020-04-10 MED ORDER — METFORMIN HCL 500 MG PO TABS: 500.0000 mg | ORAL_TABLET | Freq: Every day | ORAL | 0 refills | Status: DC

## 2020-04-10 MED ORDER — LISINOPRIL 10 MG PO TABS: 10.0000 mg | ORAL_TABLET | Freq: Every day | ORAL | 0 refills | Status: DC

## 2020-04-10 MED ORDER — VITAMIN D (ERGOCALCIFEROL) 1.25 MG (50000 UNIT) PO CAPS: 50000.0000 [IU] | ORAL_CAPSULE | ORAL | 0 refills | Status: DC

## 2020-04-10 NOTE — Telephone Encounter (Signed)
Please advise. Thanks.  

## 2020-04-11 NOTE — Telephone Encounter (Signed)
Dr. Earlene Plater is she to stop the metoprolol and start the lisinopril.  I saw where the lisinopril was sent in but did not see where it stated her to stop the metoprolol.

## 2020-04-12 ENCOUNTER — Other Ambulatory Visit (INDEPENDENT_AMBULATORY_CARE_PROVIDER_SITE_OTHER): Payer: Self-pay | Admitting: *Deleted

## 2020-04-12 DIAGNOSIS — I10 Essential (primary) hypertension: Secondary | ICD-10-CM

## 2020-04-12 MED ORDER — LISINOPRIL 10 MG PO TABS: 10.0000 mg | ORAL_TABLET | Freq: Every day | ORAL | 0 refills | Status: DC

## 2020-04-13 ENCOUNTER — Other Ambulatory Visit (INDEPENDENT_AMBULATORY_CARE_PROVIDER_SITE_OTHER): Payer: Self-pay | Admitting: Family Medicine

## 2020-04-13 DIAGNOSIS — E559 Vitamin D deficiency, unspecified: Secondary | ICD-10-CM

## 2020-04-14 DIAGNOSIS — G4761 Periodic limb movement disorder: Secondary | ICD-10-CM | POA: Insufficient documentation

## 2020-04-14 DIAGNOSIS — G4733 Obstructive sleep apnea (adult) (pediatric): Secondary | ICD-10-CM | POA: Insufficient documentation

## 2020-04-14 NOTE — Procedures (Signed)
PATIENT'S NAME:  Sharon Robertson, Sharon Robertson DOB:      1971/11/01      MR#:    [id number]     DATE OF RECORDING: 04/05/2020 MR REFERRING M.D.:  Maximiano Coss, NP Study Performed:   Titration to positive airway pressure  HISTORY:  This 49 year- old Caucasian female patient presented with a medical history of Asthma, Hyperlipemia, and Hypertension. Further concerns were Hypoventilation secondary to obesity, shortness of breath, sleep walking and excessive daytime sleepiness.  A preceding expanded EEG montage Polysomnogram had been performed on 03/06/2020 which revealed poor sleep efficiency: It also established a diagnosis of Obstructive Sleep Apnea, at AHI 13/h, with strong supine exacerbation and without clinically relevant degree of hypoxia. Severe Periodic Limb Movement Disorder.  The patient endorsed the Epworth Sleepiness Scale at 10 points.   The patient's weight 413 pounds with a height of 69 (inches), resulting in a BMI of 61.1 kg/m2. The patient's neck circumference measured 17 inches.  CURRENT MEDICATIONS: Lipitor, Zyrtec, Hygroton, Toprol   PROCEDURE:  This is a multichannel digital polysomnogram utilizing the SomnoStar 11.2 system.  Electrodes and sensors were applied and monitored per AASM Specifications.   EEG, EOG, Chin and Limb EMG, were sampled at 200 Hz.  ECG, Snore and Nasal Pressure, Thermal Airflow, Respiratory Effort, CPAP Flow and Pressure, Oximetry was sampled at 50 Hz. Digital video and audio were recorded.      CPAP was initiated with a medium-sized Simplus FFM at 5 cmH20, under heated humidity per AASM split night standards . The pressure was advanced to 13 cmH20 because of hypopneas, apneas and desaturations.  At a PAP pressure of 12 cmH20, there was a reduction of the AHI to 0.0/h with improvement of sleep apnea. Sleep efficiency was much improved as CPAP was used.   Lights Out was at 22:03 and Lights On at 05:00. Total recording time (TRT) was 418 minutes, with a total sleep time  (TST) of 366 minutes. The patient's sleep latency was 5 minutes. REM latency was 64 minutes.  The sleep efficiency was 87.6 %.    SLEEP ARCHITECTURE: WASO (Wake after sleep onset) was 47 minutes.  There were 4 minutes in Stage N1, 193 minutes Stage N2, 98 minutes Stage N3 and 71 minutes in Stage REM.  The percentage of Stage N1 was 1.1%, Stage N2 was 52.7%, Stage N3 was 26.8% and Stage R (REM sleep) was 19.4%.   RESPIRATORY ANALYSIS:  There was a total of 53 respiratory events: 6 obstructive apneas, 0 central apneas and 1 mixed apnea with a total of 7 apneas and an apnea index (AHI) of 1.1 /hour. There were 46 hypopneas with a hypopnea index of 7.5/hour.  The total APNEA/HYPOPNEA INDEX  (AHI) was 8.7 /hour .5 events occurred in REM sleep and 48 events in NREM. The REM AHI was 4.2 /hour versus a non-REM AHI of 9.8 /hour.  The patient spent 49 minutes of total sleep time in the supine position and 317 minutes in non-supine. The supine AHI was 3.6, versus a non-supine AHI of 9.4.  OXYGEN SATURATION & C02:  The baseline 02 saturation was 96%, with the lowest being 88%. Time spent below 89% saturation equaled 0 minutes.  The arousals were noted as: 68 were spontaneous, 2 were associated with PLMs, and 47 were associated with respiratory events. The patient had a total of 26 Periodic Limb Movements. The Periodic Limb Movement Arousal index was 0.3 /hour. Audio and video analysis did not show any abnormal or unusual movements,  behaviors, phonations or vocalizations.  REM sleep rebounded within 60 minutes.  EKG was in keeping with normal sinus rhythm, some PVCs, one brief run of V- tachycardia.   DIAGNOSIS 1. Obstructive Sleep Apnea and PLMs were alleviated under CPAP pressures of 10, 12, and 13 cm water. No Central Sleep Apnea arose under PAP treatment.  2. Singular run of V-tech, otherwise isolated PVCs, abnormal EKG  PLANS/RECOMMENDATIONS: The patient was given a Medium sized Simplus FFM mask and if she  let comfortable , she should continue its use. The CPAP device will be set for 6 through 16 cm water pressure, 2 cm water for EPR, and heated humidity with above named interface.  CPAP therapy complicate is defined as 4 hours or more of nightly use.   DISCUSSION:A follow up appointment will be scheduled in the Sleep Clinic at [facility].   Please call [phone] with any questions.      I certify that I have reviewed the entire raw data recording prior to the issuance of this report in accordance with the Standards of Accreditation of the American Academy of Sleep Medicine (AASM)    Melvyn Novas, M.D. Diplomat, Biomedical engineer of Psychiatry and Neurology  Diplomat, Biomedical engineer of Sleep Medicine Wellsite geologist, [employer] [facility] at [employer] of Sleep Medicine

## 2020-04-14 NOTE — Addendum Note (Signed)
Addended by: Melvyn Novas on: 04/14/2020 06:26 PM   Modules accepted: Orders

## 2020-04-15 ENCOUNTER — Other Ambulatory Visit (INDEPENDENT_AMBULATORY_CARE_PROVIDER_SITE_OTHER): Payer: Self-pay | Admitting: Family Medicine

## 2020-04-15 DIAGNOSIS — R7303 Prediabetes: Secondary | ICD-10-CM

## 2020-04-17 ENCOUNTER — Encounter: Payer: Self-pay | Admitting: Neurology

## 2020-04-17 ENCOUNTER — Telehealth: Payer: Self-pay | Admitting: Neurology

## 2020-04-17 NOTE — Telephone Encounter (Signed)
Called patient to discuss sleep study results. No answer at this time. LVM for the patient to call back.  Will also send a mychart message but advised the patient to call back to confirm ok to send order and schedule initial CPAP follow up.

## 2020-04-17 NOTE — Telephone Encounter (Signed)
Pt returned call. I advised pt that Dr. Vickey Huger reviewed their sleep study results and found that pt was best treated with CPAP at pressure of 12/13 cm water pressure. Dr. Vickey Huger recommends that pt starts auto CPAP 6-16 cm. I reviewed PAP compliance expectations with the pt. Pt is agreeable to starting a CPAP. I advised pt that an order will be sent to a DME, [facility], and [facility] will call the pt within about one week after they file with the pt's insurance. [facility] will show the pt how to use the machine, fit for masks, and troubleshoot the CPAP if needed. A follow up appt was made for insurance purposes with Dr. Vickey Huger on Aug 4,2021 at 3:30 pm. Pt verbalized understanding to arrive 15 minutes early and bring their CPAP. A letter with all of this information in it will be mailed to the pt as a reminder. I verified with the pt that the address we have on file is correct. Pt verbalized understanding of results. Pt had no questions at this time but was encouraged to call back if questions arise. I have sent the order to [facility] and have received confirmation that they have received the order.

## 2020-05-02 ENCOUNTER — Other Ambulatory Visit: Payer: Self-pay

## 2020-05-02 ENCOUNTER — Ambulatory Visit (INDEPENDENT_AMBULATORY_CARE_PROVIDER_SITE_OTHER): Payer: BC Managed Care – PPO | Admitting: Family Medicine

## 2020-05-02 ENCOUNTER — Encounter (INDEPENDENT_AMBULATORY_CARE_PROVIDER_SITE_OTHER): Payer: Self-pay | Admitting: Family Medicine

## 2020-05-02 VITALS — BP 112/75 | HR 93 | Temp 97.6°F | Ht 69.0 in | Wt 390.0 lb

## 2020-05-02 DIAGNOSIS — Z9989 Dependence on other enabling machines and devices: Secondary | ICD-10-CM

## 2020-05-02 DIAGNOSIS — R7303 Prediabetes: Secondary | ICD-10-CM | POA: Diagnosis not present

## 2020-05-02 DIAGNOSIS — E559 Vitamin D deficiency, unspecified: Secondary | ICD-10-CM

## 2020-05-02 DIAGNOSIS — Z9189 Other specified personal risk factors, not elsewhere classified: Secondary | ICD-10-CM

## 2020-05-02 DIAGNOSIS — Z6841 Body Mass Index (BMI) 40.0 and over, adult: Secondary | ICD-10-CM

## 2020-05-02 DIAGNOSIS — G4733 Obstructive sleep apnea (adult) (pediatric): Secondary | ICD-10-CM | POA: Diagnosis not present

## 2020-05-02 MED ORDER — PHENTERMINE HCL 8 MG PO TABS: ORAL_TABLET | ORAL | 0 refills | Status: DC

## 2020-05-02 MED ORDER — VITAMIN D (ERGOCALCIFEROL) 1.25 MG (50000 UNIT) PO CAPS: 50000.0000 [IU] | ORAL_CAPSULE | ORAL | 0 refills | Status: DC

## 2020-05-02 MED ORDER — METFORMIN HCL 500 MG PO TABS: 500.0000 mg | ORAL_TABLET | Freq: Every day | ORAL | 0 refills | Status: DC

## 2020-05-02 NOTE — Progress Notes (Signed)
Chief Complaint:   OBESITY Cam is here to discuss her progress with her obesity treatment plan along with follow-up of her obesity related diagnoses. Joyann is on the Category 4 Plan and states she is following her eating plan approximately 100% of the time. Elta states she is walking for 30 minutes 5 times per week.  Today's visit was #: 5 Starting weight: 410 lbs Starting date: 02/09/2020 Today's weight: 390 lbs Today's date: 05/02/2020 Total lbs lost to date: 20 lbs Total lbs lost since last in-office visit: 7 lbs  Interim History: Cydni is down 20 pounds today!  She says she went to a hockey game and was able to fit comfortably in the chair.  Denies polyphagia.  No cravings.  She says she is still tired.  Subjective:   1. Vitamin D deficiency Arnett's Vitamin D level was 20.6 on 02/09/2020. She is currently taking prescription vitamin D 50,000 IU each week. She denies nausea, vomiting or muscle weakness.  2. Prediabetes Darothy has a diagnosis of prediabetes based on her elevated HgA1c and was informed this puts her at greater risk of developing diabetes. She continues to work on diet and exercise to decrease her risk of diabetes. She denies nausea or hypoglycemia.  Lab Results  Component Value Date   HGBA1C 5.9 (H) 01/04/2020   3. OSA on CPAP April has a diagnosis of sleep apnea. She reports that she is using a CPAP regularly.  She is now using a CPAP.  She says she is still tired, but she is hoping continuing use will help.  I have reviewed the notes and messages.  Assessment/Plan:   1. Vitamin D deficiency Low Vitamin D level contributes to fatigue and are associated with obesity, breast, and colon cancer. She agrees to continue to take prescription Vitamin D @50 ,000 IU every week and will follow-up for routine testing of Vitamin D, at least 2-3 times per year to avoid over-replacement.  Orders - Vitamin D, Ergocalciferol, (DRISDOL) 1.25 MG (50000 UNIT) CAPS capsule;  Take 1 capsule (50,000 Units total) by mouth every 7 (seven) days.  Dispense: 4 capsule; Refill: 0  2. Prediabetes Nakema will continue to work on weight loss, exercise, and decreasing simple carbohydrates to help decrease the risk of diabetes.   Orders - metFORMIN (GLUCOPHAGE) 500 MG tablet; Take 1 tablet (500 mg total) by mouth daily with breakfast.  Dispense: 30 tablet; Refill: 0  3. OSA on CPAP Intensive lifestyle modifications are the first line treatment for this issue. We discussed several lifestyle modifications today and she will continue to work on diet, exercise and weight loss efforts. We will continue to monitor. Orders and follow up as documented in patient record.   Counseling  Sleep apnea is a condition in which breathing pauses or becomes shallow during sleep. This happens over and over during the night. This disrupts your sleep and keeps your body from getting the rest that it needs, which can cause tiredness and lack of energy (fatigue) during the day.  Sleep apnea treatment: If you were given a device to open your airway while you sleep, USE IT!  Sleep hygiene:   Limit or avoid alcohol, caffeinated beverages, and cigarettes, especially close to bedtime.   Do not eat a large meal or eat spicy foods right before bedtime. This can lead to digestive discomfort that can make it hard for you to sleep.  Keep a sleep diary to help you and your health care provider figure out what  could be causing your insomnia.  . Make your bedroom a dark, comfortable place where it is easy to fall asleep. ? Put up shades or blackout curtains to block light from outside. ? Use a white noise machine to block noise. ? Keep the temperature cool. . Limit screen use before bedtime. This includes: ? Watching TV. ? Using your smartphone, tablet, or computer. . Stick to a routine that includes going to bed and waking up at the same times every day and night. This can help you fall asleep faster.  Consider making a quiet activity, such as reading, part of your nighttime routine. . Try to avoid taking naps during the day so that you sleep better at night. . Get out of bed if you are still awake after 15 minutes of trying to sleep. Keep the lights down, but try reading or doing a quiet activity. When you feel sleepy, go back to bed.  4. At risk for deficient intake of food Tyaisha was given approximately 15 minutes of deficit intake of food prevention counseling today. Georgeann is at risk for eating too few calories based on current food recall. She was encouraged to focus on meeting caloric and protein goals according to her recommended meal plan.   5. Class 3 severe obesity with serious comorbidity and body mass index (BMI) of 50.0 to 59.9 in adult, unspecified obesity type (HCC) - Phentermine HCl 8 MG TABS; Take 1 tablet in the morning and 1 tablet at lunch  Dispense: 28 tablet; Refill: 0  Justiss is currently in the action stage of change. As such, her goal is to continue with weight loss efforts. She has agreed to the Category 4 Plan.   Exercise goals: For substantial health benefits, adults should do at least 150 minutes (2 hours and 30 minutes) a week of moderate-intensity, or 75 minutes (1 hour and 15 minutes) a week of vigorous-intensity aerobic physical activity, or an equivalent combination of moderate- and vigorous-intensity aerobic activity. Aerobic activity should be performed in episodes of at least 10 minutes, and preferably, it should be spread throughout the week.  Behavioral modification strategies: increasing water intake.  Kaiyla has agreed to follow-up with our clinic in 3 weeks. She was informed of the importance of frequent follow-up visits to maximize her success with intensive lifestyle modifications for her multiple health conditions.   Objective:   Blood pressure 112/75, pulse 93, temperature 97.6 F (36.4 C), temperature source Oral, height 5\' 9"  (1.753 m), weight (!)  390 lb (176.9 kg), last menstrual period 04/25/2020, SpO2 96 %. Body mass index is 57.59 kg/m.  General: Cooperative, alert, well developed, in no acute distress. HEENT: Conjunctivae and lids unremarkable. Cardiovascular: Regular rhythm.  Lungs: Normal work of breathing. Neurologic: No focal deficits.   Lab Results  Component Value Date   CREATININE 0.65 07/05/2019   BUN 13 07/05/2019   NA 137 07/05/2019   K 4.5 07/05/2019   CL 100 07/05/2019   CO2 23 07/05/2019   Lab Results  Component Value Date   ALT 25 07/05/2019   AST 17 07/05/2019   ALKPHOS 82 07/05/2019   BILITOT 0.3 07/05/2019   Lab Results  Component Value Date   HGBA1C 5.9 (H) 01/04/2020   HGBA1C 5.9 (H) 07/05/2019   HGBA1C 5.6 03/20/2018   Lab Results  Component Value Date   TSH 1.990 07/05/2019   Lab Results  Component Value Date   CHOL 202 (H) 01/04/2020   HDL 48 01/04/2020   LDLCALC  132 (H) 01/04/2020   TRIG 124 01/04/2020   CHOLHDL 4.2 01/04/2020   Lab Results  Component Value Date   WBC 9.7 01/04/2020   HGB 12.5 01/04/2020   HCT 39.0 02/09/2020   MCV 77 (L) 01/04/2020   PLT 465 (H) 01/04/2020   Lab Results  Component Value Date   IRON 93 02/09/2020   TIBC 363 02/09/2020   FERRITIN 42 02/09/2020   Attestation Statements:   Reviewed by clinician on day of visit: allergies, medications, problem list, medical history, surgical history, family history, social history, and previous encounter notes.  I, Water quality scientist, CMA, am acting as Location manager for [employer], DO.  I have reviewed the above documentation for accuracy and completeness, and I agree with the above. Briscoe Deutscher, DO

## 2020-05-03 ENCOUNTER — Other Ambulatory Visit (INDEPENDENT_AMBULATORY_CARE_PROVIDER_SITE_OTHER): Payer: Self-pay | Admitting: Family Medicine

## 2020-05-03 DIAGNOSIS — I10 Essential (primary) hypertension: Secondary | ICD-10-CM

## 2020-05-03 NOTE — Telephone Encounter (Signed)
LM for pt to call back.

## 2020-05-13 ENCOUNTER — Other Ambulatory Visit (INDEPENDENT_AMBULATORY_CARE_PROVIDER_SITE_OTHER): Payer: Self-pay | Admitting: Family Medicine

## 2020-05-13 DIAGNOSIS — E559 Vitamin D deficiency, unspecified: Secondary | ICD-10-CM

## 2020-05-17 ENCOUNTER — Other Ambulatory Visit (INDEPENDENT_AMBULATORY_CARE_PROVIDER_SITE_OTHER): Payer: Self-pay | Admitting: Family Medicine

## 2020-05-17 DIAGNOSIS — R7303 Prediabetes: Secondary | ICD-10-CM

## 2020-05-23 ENCOUNTER — Ambulatory Visit (INDEPENDENT_AMBULATORY_CARE_PROVIDER_SITE_OTHER): Payer: BC Managed Care – PPO | Admitting: Adult Health

## 2020-05-23 ENCOUNTER — Other Ambulatory Visit: Payer: Self-pay

## 2020-05-23 ENCOUNTER — Encounter (INDEPENDENT_AMBULATORY_CARE_PROVIDER_SITE_OTHER): Payer: Self-pay | Admitting: Adult Health

## 2020-05-23 VITALS — BP 135/77 | HR 87 | Temp 97.5°F | Ht 69.0 in | Wt 387.0 lb

## 2020-05-23 DIAGNOSIS — Z9189 Other specified personal risk factors, not elsewhere classified: Secondary | ICD-10-CM | POA: Diagnosis not present

## 2020-05-23 DIAGNOSIS — I10 Essential (primary) hypertension: Secondary | ICD-10-CM | POA: Diagnosis not present

## 2020-05-23 DIAGNOSIS — Z6841 Body Mass Index (BMI) 40.0 and over, adult: Secondary | ICD-10-CM

## 2020-05-23 DIAGNOSIS — R7303 Prediabetes: Secondary | ICD-10-CM | POA: Diagnosis not present

## 2020-05-23 DIAGNOSIS — E559 Vitamin D deficiency, unspecified: Secondary | ICD-10-CM

## 2020-05-23 MED ORDER — PHENTERMINE HCL 8 MG PO TABS: ORAL_TABLET | ORAL | 0 refills | Status: DC

## 2020-05-23 NOTE — Progress Notes (Signed)
Chief Complaint:   OBESITY Sharon Robertson is here to discuss her progress with her obesity treatment plan along with follow-up of her obesity related diagnoses. Sharon Robertson is keeping a food journal and adhering to recommended goals of 1800 calories and 120 grams of protein and states she is following her eating plan approximately 100% of the time. Sharon Robertson states she is swimming/walking 60 minutes 5 times per week.  Today's visit was #: 6 Starting weight: 410 lbs Starting date: 02/09/2020 Today's weight: 387 lbs Today's date: 05/23/2020 Total lbs lost to date: 23 Total lbs lost since last in-office visit: 3  Interim History: Sharon Robertson has really enjoyed the flexibility of the journaling plan and has been consuming her largest meal of the day at lunchtime. She reports increased energy and less anxiety about eating since she has started the program..  Subjective:   Essential hypertension. Blood pressure is at goal today. She is on lisinopril 10 mg daily and tolerating this well.  BP Readings from Last 3 Encounters:  05/23/20 135/77  05/02/20 112/75  04/05/20 115/74   Lab Results  Component Value Date   CREATININE 0.65 07/05/2019   CREATININE 0.70 03/31/2018   CREATININE 0.67 03/20/2018   Prediabetes. Sharon Robertson has a diagnosis of prediabetes based on her elevated HgA1c and was informed this puts her at greater risk of developing diabetes. She continues to work on diet and exercise to decrease her risk of diabetes. She denies nausea or hypoglycemia. Sharon Robertson is on metformin 500 mg with breakfast and tolerating this well.  Lab Results  Component Value Date   HGBA1C 5.9 (H) 01/04/2020   No results found for: INSULIN  Vitamin D deficiency. Sharon Robertson is on prescription strength Vitamin D supplementation and tolerating this well. Last Vitamin D was 20.6 on 02/09/2020.  At risk for osteoporosis. Sharon Robertson is at higher risk of osteopenia and osteoporosis due to Vitamin D deficiency and obesity.    Assessment/Plan:   Essential hypertension. Sharon Robertson is working on healthy weight loss and exercise to improve blood pressure control. We will watch for signs of hypotension as she continues her lifestyle modifications. Sharon Robertson will continue her ACE and CBC with Differential/Platelet, Comprehensive metabolic panel, Lipid Panel With LDL/HDL Ratio, T3, T4, free, TSH will be checked today.  Prediabetes. Sharon Robertson will continue to work on weight loss, exercise, and decreasing simple carbohydrates to help decrease the risk of diabetes. She will continue metformin as directed with no need for a refill today. Hemoglobin A1c, Insulin, random will be checked today.  Vitamin D deficiency. Low Vitamin D level contributes to fatigue and are associated with obesity, breast, and colon cancer. VITAMIN D 25 Hydroxy (Vit-D Deficiency, Fractures) level will be checked today.  At risk for osteoporosis. Sharon Robertson was given approximately 15 minutes of osteoporosis prevention counseling today. Sharon Robertson is at risk for osteopenia and osteoporosis due to her Vitamin D deficiency. She was encouraged to take her Vitamin D and follow her higher calcium diet and increase strengthening exercise to help strengthen her bones and decrease her risk of osteopenia and osteoporosis.  Repetitive spaced learning was employed today to elicit superior memory formation and behavioral change.  Class 3 severe obesity with serious comorbidity and body mass index (BMI) of 50.0 to 59.9 in adult, unspecified obesity type ([place]). Refill was given for Phentermine HCl 8 MG TABS 1 tab in the morning and 1 tab at lunch, #28 with 0 refills.- Unable to send Rx via electronic system, provided hand written rx to pt.  PDMP reviewed- Phentermine last filled on 05/03/2020, not other controlled substances listed in sytem.  Sharon Robertson is currently in the action stage of change. As such, her goal is to continue with weight loss efforts. She has agreed to keeping a food journal and  adhering to recommended goals of 1800 calories and 120 grams of protein daily.   Exercise goals: Sharon Robertson will continue her current exercise regimen.   Behavioral modification strategies: increasing lean protein intake, meal planning and cooking strategies, celebration eating strategies, planning for success and keeping a strict food journal.  Sharon Robertson has agreed to follow-up with our clinic in 2 weeks. She was informed of the importance of frequent follow-up visits to maximize her success with intensive lifestyle modifications for her multiple health conditions.   Sharon Robertson was informed we would discuss her lab results at her next visit unless there is a critical issue that needs to be addressed sooner. Sharon Robertson agreed to keep her next visit at the agreed upon time to discuss these results.  Objective:   Blood pressure 135/77, pulse 87, temperature (!) 97.5 F (36.4 C), temperature source Oral, height 5\' 9"  (1.753 m), weight (!) 387 lb (175.5 kg), last menstrual period 04/25/2020, SpO2 99 %. Body mass index is 57.15 kg/m.  General: Cooperative, alert, well developed, in no acute distress. HEENT: Conjunctivae and lids unremarkable. Cardiovascular: Regular rhythm.  Lungs: Normal work of breathing. Neurologic: No focal deficits.   Lab Results  Component Value Date   CREATININE 0.65 07/05/2019   BUN 13 07/05/2019   NA 137 07/05/2019   K 4.5 07/05/2019   CL 100 07/05/2019   CO2 23 07/05/2019   Lab Results  Component Value Date   ALT 25 07/05/2019   AST 17 07/05/2019   ALKPHOS 82 07/05/2019   BILITOT 0.3 07/05/2019   Lab Results  Component Value Date   HGBA1C 5.9 (H) 01/04/2020   HGBA1C 5.9 (H) 07/05/2019   HGBA1C 5.6 03/20/2018   No results found for: INSULIN Lab Results  Component Value Date   TSH 1.990 07/05/2019   Lab Results  Component Value Date   CHOL 202 (H) 01/04/2020   HDL 48 01/04/2020   LDLCALC 132 (H) 01/04/2020   TRIG 124 01/04/2020   CHOLHDL 4.2 01/04/2020   Lab  Results  Component Value Date   WBC 9.7 01/04/2020   HGB 12.5 01/04/2020   HCT 39.0 02/09/2020   MCV 77 (L) 01/04/2020   PLT 465 (H) 01/04/2020   Lab Results  Component Value Date   IRON 93 02/09/2020   TIBC 363 02/09/2020   FERRITIN 42 02/09/2020   Attestation Statements:   Reviewed by clinician on day of visit: allergies, medications, problem list, medical history, surgical history, family history, social history, and previous encounter notes.  I, 04/10/2020, am acting as Marianna Payment for Energy manager, NP-C   I have reviewed the above documentation for accuracy and completeness, and I agree with the above. -  [employer], NP

## 2020-05-24 LAB — COMPREHENSIVE METABOLIC PANEL
ALT: 19 IU/L (ref 0–32)
AST: 16 IU/L (ref 0–40)
Albumin/Globulin Ratio: 1.2 (ref 1.2–2.2)
Albumin: 3.7 g/dL — ABNORMAL LOW (ref 3.8–4.8)
Alkaline Phosphatase: 90 IU/L (ref 48–121)
BUN/Creatinine Ratio: 32 — ABNORMAL HIGH (ref 9–23)
BUN: 19 mg/dL (ref 6–24)
Bilirubin Total: 0.3 mg/dL (ref 0.0–1.2)
CO2: 23 mmol/L (ref 20–29)
Calcium: 9.2 mg/dL (ref 8.7–10.2)
Chloride: 101 mmol/L (ref 96–106)
Creatinine, Ser: 0.59 mg/dL (ref 0.57–1.00)
GFR calc Af Amer: 124 mL/min/{1.73_m2} (ref >59)
GFR calc non Af Amer: 108 mL/min/{1.73_m2} (ref >59)
Globulin, Total: 3.1 g/dL (ref 1.5–4.5)
Glucose: 103 mg/dL — ABNORMAL HIGH (ref 65–99)
Potassium: 4.5 mmol/L (ref 3.5–5.2)
Sodium: 137 mmol/L (ref 134–144)
Total Protein: 6.8 g/dL (ref 6.0–8.5)

## 2020-05-24 LAB — CBC WITH DIFFERENTIAL/PLATELET
Basophils Absolute: 0.1 10*3/uL (ref 0.0–0.2)
Basos: 1 %
EOS (ABSOLUTE): 0.2 10*3/uL (ref 0.0–0.4)
Eos: 3 %
Hematocrit: 38.9 % (ref 34.0–46.6)
Hemoglobin: 12.2 g/dL (ref 11.1–15.9)
Immature Grans (Abs): 0 10*3/uL (ref 0.0–0.1)
Immature Granulocytes: 0 %
Lymphocytes Absolute: 2.2 10*3/uL (ref 0.7–3.1)
Lymphs: 25 %
MCH: 26.1 pg — ABNORMAL LOW (ref 26.6–33.0)
MCHC: 31.4 g/dL — ABNORMAL LOW (ref 31.5–35.7)
MCV: 83 fL (ref 79–97)
Monocytes Absolute: 0.5 10*3/uL (ref 0.1–0.9)
Monocytes: 6 %
Neutrophils Absolute: 5.5 10*3/uL (ref 1.4–7.0)
Neutrophils: 65 %
Platelets: 396 10*3/uL (ref 150–450)
RBC: 4.67 x10E6/uL (ref 3.77–5.28)
RDW: 13.6 % (ref 11.7–15.4)
WBC: 8.5 10*3/uL (ref 3.4–10.8)

## 2020-05-24 LAB — LIPID PANEL WITH LDL/HDL RATIO
Cholesterol, Total: 173 mg/dL (ref 100–199)
HDL: 44 mg/dL (ref >39)
LDL Chol Calc (NIH): 112 mg/dL — ABNORMAL HIGH (ref 0–99)
LDL/HDL Ratio: 2.5 ratio (ref 0.0–3.2)
Triglycerides: 92 mg/dL (ref 0–149)
VLDL Cholesterol Cal: 17 mg/dL (ref 5–40)

## 2020-05-24 LAB — T4, FREE: Free T4: 1.16 ng/dL (ref 0.82–1.77)

## 2020-05-24 LAB — INSULIN, RANDOM: INSULIN: 13.6 u[IU]/mL (ref 2.6–24.9)

## 2020-05-24 LAB — VITAMIN D 25 HYDROXY (VIT D DEFICIENCY, FRACTURES): Vit D, 25-Hydroxy: 31.9 ng/mL (ref 30.0–100.0)

## 2020-05-24 LAB — HEMOGLOBIN A1C
Est. average glucose Bld gHb Est-mCnc: 126 mg/dL
Hgb A1c MFr Bld: 6 % — ABNORMAL HIGH (ref 4.8–5.6)

## 2020-05-24 LAB — TSH: TSH: 2.41 u[IU]/mL (ref 0.450–4.500)

## 2020-05-24 LAB — T3: T3, Total: 114 ng/dL (ref 71–180)

## 2020-06-01 ENCOUNTER — Other Ambulatory Visit (INDEPENDENT_AMBULATORY_CARE_PROVIDER_SITE_OTHER): Payer: Self-pay | Admitting: Family Medicine

## 2020-06-01 DIAGNOSIS — I10 Essential (primary) hypertension: Secondary | ICD-10-CM

## 2020-06-06 ENCOUNTER — Other Ambulatory Visit: Payer: Self-pay

## 2020-06-06 ENCOUNTER — Encounter (INDEPENDENT_AMBULATORY_CARE_PROVIDER_SITE_OTHER): Payer: Self-pay | Admitting: Adult Health

## 2020-06-06 ENCOUNTER — Ambulatory Visit (INDEPENDENT_AMBULATORY_CARE_PROVIDER_SITE_OTHER): Payer: BC Managed Care – PPO | Admitting: Adult Health

## 2020-06-06 VITALS — BP 118/80 | HR 88 | Temp 97.6°F | Ht 69.0 in | Wt 386.0 lb

## 2020-06-06 DIAGNOSIS — Z9189 Other specified personal risk factors, not elsewhere classified: Secondary | ICD-10-CM

## 2020-06-06 DIAGNOSIS — I10 Essential (primary) hypertension: Secondary | ICD-10-CM

## 2020-06-06 DIAGNOSIS — R7303 Prediabetes: Secondary | ICD-10-CM | POA: Diagnosis not present

## 2020-06-06 DIAGNOSIS — Z6841 Body Mass Index (BMI) 40.0 and over, adult: Secondary | ICD-10-CM

## 2020-06-06 DIAGNOSIS — E559 Vitamin D deficiency, unspecified: Secondary | ICD-10-CM

## 2020-06-06 DIAGNOSIS — E66813 Obesity, class 3: Secondary | ICD-10-CM

## 2020-06-06 MED ORDER — METFORMIN HCL 500 MG PO TABS: 500.0000 mg | ORAL_TABLET | Freq: Two times a day (BID) | ORAL | 0 refills | Status: DC

## 2020-06-06 MED ORDER — VITAMIN D (ERGOCALCIFEROL) 1.25 MG (50000 UNIT) PO CAPS: 50000.0000 [IU] | ORAL_CAPSULE | ORAL | 0 refills | Status: DC

## 2020-06-06 MED ORDER — LISINOPRIL 10 MG PO TABS: 10.0000 mg | ORAL_TABLET | Freq: Every day | ORAL | 0 refills | Status: DC

## 2020-06-06 NOTE — Progress Notes (Addendum)
Chief Complaint:   OBESITY Sharon Robertson is here to discuss her progress with her obesity treatment plan along with follow-up of her obesity related diagnoses. Sharon Robertson is keeping a food journal and adhering to recommended goals of 1800 calories and 120 grams of protein and states she is following her eating plan approximately 100% of the time. Sharon Robertson states she is swimming 45-60 minutes 4 times per week.  Today's visit was #: 7 Starting weight: 410 lbs Starting date: 02/09/2020 Today's weight: 386 lbs Today's date: 06/06/2020 Total lbs lost to date: 24 Total lbs lost since last in-office visit: 1  Interim History: Sharon Robertson has enjoyed the flexibility of the journaling plan and denies feeling deprived. She continues to experience afternoon polyphagia. 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 She is awaiting approval to sit for her boards and weighing current job opportunities.  Subjective:   Vitamin D deficiency. Sharon Robertson is on prescription strength Vitamin D supplementation. No nausea, vomiting, or muscle weakness. Labs were discussed with pt today.   Ref. Range 05/23/2020 14:18  Vitamin D, 25-Hydroxy Latest Ref Range: 30.0 - 100.0 ng/mL 31.9   Essential hypertension. Blood pressure is at goal at today's office visit. She is on lisinopril 10 mg daily and denies ACE cough. Labs were discussed with pt today.  BP Readings from Last 3 Encounters:  06/06/20 118/80  05/23/20 135/77  05/02/20 112/75   Lab Results  Component Value Date   CREATININE 0.59 05/23/2020   CREATININE 0.65 07/05/2019   CREATININE 0.70 03/31/2018   Prediabetes. Sharon Robertson has a diagnosis of prediabetes based on her elevated HgA1c and was informed this puts her at greater risk of developing diabetes. She continues to work on diet and exercise to decrease her risk of diabetes. She denies nausea or hypoglycemia. Sharon Robertson is on metformin 500 mg daily and reports  afternoon polyphagia. She denies current GI upset. She is agreeable to increase her metformin dose to BID.  Labs were discussed with pt today.  Lab Results  Component Value Date   HGBA1C 6.0 (H) 05/23/2020   Lab Results  Component Value Date   INSULIN 13.6 05/23/2020   At risk for diarrhea. Sharon Robertson is at higher risk of diarrhea due to increased metformin dose to treat prediabetes/polyphagia.  Assessment/Plan:   Vitamin D deficiency. Low Vitamin D level contributes to fatigue and are associated with obesity, breast, and colon cancer. She was given a refill on her Vitamin D, Ergocalciferol, (DRISDOL) 1.25 MG (50000 UNIT) CAPS capsule every week #4 with 0 refills and will follow-up for routine testing of Vitamin D every 3 months.   Essential hypertension. Sharon Robertson is working on healthy weight loss and exercise to improve blood pressure control. We will watch for signs of hypotension as she continues her lifestyle modifications. Refill was given for lisinopril (ZESTRIL) 10 MG tablet daily #30 with 0 refills.  Prediabetes. Sharon Robertson will continue to work on weight loss, exercise, and decreasing simple carbohydrates to help decrease the risk of diabetes. Refill was given for increased dose of metFORMIN (GLUCOPHAGE) to 500 MG tablet BID #60 with 0 refills. She will have labs checked every 3 months.  At risk for diarrhea. Sharon Robertson was given approximately 15 minutes of diarrhea prevention counseling today. She is 49 y.o. female and has risk factors for diarrhea including medications and changes in diet. We discussed intensive lifestyle modifications today with an emphasis on specific weight loss instructions including dietary strategies.   Repetitive  spaced learning was employed today to elicit superior memory formation and behavioral change.  Class 3 severe obesity with serious comorbidity and body mass index (BMI) of 50.0 to 59.9 in adult, unspecified obesity type ([facility]). She was previously on Phentermine 8 mg  QD.  Due to medication ineffectiveness RX d/c'd.  Sharon Robertson is currently in the action stage of change. As such, her goal is to continue with weight loss efforts. She has agreed to keeping a food journal and adhering to recommended goals of 1800 calories and 120+ grams of protein daily.   Exercise goals: Sharon Robertson will continue her current exercise regimen.   Behavioral modification strategies: increasing lean protein intake, meal planning and cooking strategies, planning for success and keeping a strict food journal.  Sharon Robertson has agreed to follow-up with our clinic in 2 weeks. She was informed of the importance of frequent follow-up visits to maximize her success with intensive lifestyle modifications for her multiple health conditions.   Objective:   Blood pressure 118/80, pulse 88, temperature 97.6 F (36.4 C), temperature source Oral, height 5\' 9"  (1.753 m), weight (!) 386 lb (175.1 kg), SpO2 98 %. Body mass index is 57 kg/m.  General: Cooperative, alert, well developed, in no acute distress. HEENT: Conjunctivae and lids unremarkable. Cardiovascular: Regular rhythm.  Lungs: Normal work of breathing. Neurologic: No focal deficits.   Lab Results  Component Value Date   CREATININE 0.59 05/23/2020   BUN 19 05/23/2020   NA 137 05/23/2020   K 4.5 05/23/2020   CL 101 05/23/2020   CO2 23 05/23/2020   Lab Results  Component Value Date   ALT 19 05/23/2020   AST 16 05/23/2020   ALKPHOS 90 05/23/2020   BILITOT 0.3 05/23/2020   Lab Results  Component Value Date   HGBA1C 6.0 (H) 05/23/2020   HGBA1C 5.9 (H) 01/04/2020   HGBA1C 5.9 (H) 07/05/2019   HGBA1C 5.6 03/20/2018   Lab Results  Component Value Date   INSULIN 13.6 05/23/2020   Lab Results  Component Value Date   TSH 2.410 05/23/2020   Lab Results  Component Value Date   CHOL 173 05/23/2020   HDL 44 05/23/2020   LDLCALC 112 (H) 05/23/2020   TRIG 92 05/23/2020   CHOLHDL 4.2 01/04/2020   Lab Results  Component Value Date     WBC 8.5 05/23/2020   HGB 12.2 05/23/2020   HCT 38.9 05/23/2020   MCV 83 05/23/2020   PLT 396 05/23/2020   Lab Results  Component Value Date   IRON 93 02/09/2020   TIBC 363 02/09/2020   FERRITIN 42 02/09/2020   Attestation Statements:   Reviewed by clinician on day of visit: allergies, medications, problem list, medical history, surgical history, family history, social history, and previous encounter notes.  I, 04/10/2020, am acting as Marianna [employer] for [employer] manager, NP-C   I have reviewed the above documentation for accuracy and completeness, and I agree with the above. -  [employer], NP

## 2020-06-10 ENCOUNTER — Other Ambulatory Visit (INDEPENDENT_AMBULATORY_CARE_PROVIDER_SITE_OTHER): Payer: Self-pay | Admitting: Family Medicine

## 2020-06-10 DIAGNOSIS — E559 Vitamin D deficiency, unspecified: Secondary | ICD-10-CM

## 2020-06-12 ENCOUNTER — Other Ambulatory Visit (INDEPENDENT_AMBULATORY_CARE_PROVIDER_SITE_OTHER): Payer: Self-pay | Admitting: Family Medicine

## 2020-06-12 DIAGNOSIS — R7303 Prediabetes: Secondary | ICD-10-CM

## 2020-06-15 ENCOUNTER — Other Ambulatory Visit: Payer: Self-pay | Admitting: Neurology

## 2020-06-20 ENCOUNTER — Ambulatory Visit (INDEPENDENT_AMBULATORY_CARE_PROVIDER_SITE_OTHER): Payer: BC Managed Care – PPO | Admitting: Adult Health

## 2020-06-20 ENCOUNTER — Encounter (INDEPENDENT_AMBULATORY_CARE_PROVIDER_SITE_OTHER): Payer: Self-pay | Admitting: Adult Health

## 2020-06-20 ENCOUNTER — Other Ambulatory Visit: Payer: Self-pay

## 2020-06-20 VITALS — BP 131/73 | HR 108 | Temp 97.9°F | Ht 69.0 in | Wt 383.0 lb

## 2020-06-20 DIAGNOSIS — Z6841 Body Mass Index (BMI) 40.0 and over, adult: Secondary | ICD-10-CM | POA: Diagnosis not present

## 2020-06-20 DIAGNOSIS — E559 Vitamin D deficiency, unspecified: Secondary | ICD-10-CM | POA: Diagnosis not present

## 2020-06-20 DIAGNOSIS — R7303 Prediabetes: Secondary | ICD-10-CM

## 2020-06-21 NOTE — Progress Notes (Signed)
Chief Complaint:   OBESITY Sharon Robertson is here to discuss her progress with her obesity treatment plan along with follow-up of her obesity related diagnoses. [name] is keeping a food journal and adhering to recommended goals of 1800 calories and 120+ grams of protein and states she is following her eating plan approximately 95% of the time. [name] states she is swimming 45-60 minutes 3-4 times per week.  Today's visit was #: 8 Starting weight: 410 lbs Starting date: 02/09/2020 Today's weight: 383 lbs Today's date: 06/20/2020 Total lbs lost to date: 27 Total lbs lost since last in-office visit: 3  Interim History: [name] reports feeling better, sleeping better, and continues to enjoy the flexibility of the journaling program. She estimates to be consuming at least 110 grams of protein a day. She has been consuming her largest meal of the day midday and a smaller meal in the evening. She will sit for [employer] 07/13/2020 - Daisey Must!  Subjective:   Prediabetes. [name] has a diagnosis of prediabetes based on her elevated HgA1c and was informed this puts her at greater risk of developing diabetes. She continues to work on diet and exercise to decrease her risk of diabetes. She denies nausea or hypoglycemia. [name] reports reduced hunger levels. She is tolerating the increased metformin dose to BID.  Lab Results  Component Value Date   HGBA1C 6.0 (H) 05/23/2020   Lab Results  Component Value Date   INSULIN 13.6 05/23/2020   Vitamin D deficiency. [name] is on prescription strength Vitamin D supplementation and denies nausea, vomiting, or muscle weakness.    Ref. Range 05/23/2020 14:18  Vitamin D, 25-Hydroxy Latest Ref Range: 30.0 - 100.0 ng/mL 31.9   Assessment/Plan:   Prediabetes. [name] will continue to work on weight loss, exercise, and decreasing simple carbohydrates to help decrease the risk of diabetes. She will continue metformin BID (no medication refill today) and  will continue the journaling plan. Labs will be checked every 3 months.  Vitamin D deficiency. Low Vitamin D level contributes to fatigue and are associated with obesity, breast, and colon cancer. She agrees to continue to take prescription Vitamin D as directed ((no medication refill today) and will follow-up for routine testing of Vitamin D every 3 months.  Class 3 severe obesity with serious comorbidity and body mass index (BMI) of 50.0 to 59.9 in adult, unspecified obesity type ([facility]).  [name] is currently in the action stage of change. As such, her goal is to continue with weight loss efforts. She has agreed to keeping a food journal and adhering to recommended goals of 1800 calories and 120+ grams of protein daily.   Exercise goals: [name] will continue her current exercise regimen.   Behavioral modification strategies: increasing lean protein intake, meal planning and cooking strategies, planning for success and keeping a strict food journal.  [name] has agreed to follow-up with our clinic in 2 weeks. She was informed of the importance of frequent follow-up visits to maximize her success with intensive lifestyle modifications for her multiple health conditions.   Objective:   Blood pressure 131/73, pulse (!) 108, temperature 97.9 F (36.6 C), temperature source Oral, height 5\' 9"  (1.753 m), weight (!) 383 lb (173.7 kg), SpO2 98 %. Body mass index is 56.56 kg/m.  General: Cooperative, alert, well developed, in no acute distress. HEENT: Conjunctivae and lids unremarkable. Cardiovascular: Regular rhythm.  Lungs: Normal work of breathing. Neurologic: No focal deficits.   Lab Results  Component Value Date  CREATININE 0.59 05/23/2020   BUN 19 05/23/2020   NA 137 05/23/2020   K 4.5 05/23/2020   CL 101 05/23/2020   CO2 23 05/23/2020   Lab Results  Component Value Date   ALT 19 05/23/2020   AST 16 05/23/2020   ALKPHOS 90 05/23/2020   BILITOT 0.3 05/23/2020   Lab Results    Component Value Date   HGBA1C 6.0 (H) 05/23/2020   HGBA1C 5.9 (H) 01/04/2020   HGBA1C 5.9 (H) 07/05/2019   HGBA1C 5.6 03/20/2018   Lab Results  Component Value Date   INSULIN 13.6 05/23/2020   Lab Results  Component Value Date   TSH 2.410 05/23/2020   Lab Results  Component Value Date   CHOL 173 05/23/2020   HDL 44 05/23/2020   LDLCALC 112 (H) 05/23/2020   TRIG 92 05/23/2020   CHOLHDL 4.2 01/04/2020   Lab Results  Component Value Date   WBC 8.5 05/23/2020   HGB 12.2 05/23/2020   HCT 38.9 05/23/2020   MCV 83 05/23/2020   PLT 396 05/23/2020   Lab Results  Component Value Date   IRON 93 02/09/2020   TIBC 363 02/09/2020   FERRITIN 42 02/09/2020   Attestation Statements:   Reviewed by clinician on day of visit: allergies, medications, problem list, medical history, surgical history, family history, social history, and previous encounter notes.  Time spent on visit including pre-visit chart review and post-visit charting and care was 25 minutes.   I, Marianna Payment, am acting as Energy manager for [employer], NP-C   I have reviewed the above documentation for accuracy and completeness, and I agree with the above. - Julaine Fusi, NP

## 2020-06-27 ENCOUNTER — Encounter (INDEPENDENT_AMBULATORY_CARE_PROVIDER_SITE_OTHER): Payer: Self-pay

## 2020-06-30 ENCOUNTER — Other Ambulatory Visit (INDEPENDENT_AMBULATORY_CARE_PROVIDER_SITE_OTHER): Payer: Self-pay | Admitting: Adult Health

## 2020-06-30 DIAGNOSIS — I10 Essential (primary) hypertension: Secondary | ICD-10-CM

## 2020-07-04 ENCOUNTER — Encounter: Payer: Self-pay | Admitting: Neurology

## 2020-07-04 ENCOUNTER — Ambulatory Visit: Payer: BC Managed Care – PPO | Admitting: Neurology

## 2020-07-04 DIAGNOSIS — G4710 Hypersomnia, unspecified: Secondary | ICD-10-CM

## 2020-07-04 DIAGNOSIS — R519 Headache, unspecified: Secondary | ICD-10-CM

## 2020-07-04 DIAGNOSIS — G4730 Sleep apnea, unspecified: Secondary | ICD-10-CM | POA: Diagnosis not present

## 2020-07-04 DIAGNOSIS — Z6841 Body Mass Index (BMI) 40.0 and over, adult: Secondary | ICD-10-CM

## 2020-07-04 NOTE — Progress Notes (Signed)
SLEEP MEDICINE CLINIC    Provider:  Melvyn Novas, MD  Primary Care Physician:  Janeece Agee, NP [address] [place] [address]     Referring Provider: [facility].    Dr Helane Rima, DO      Chief Complaint according to patient   Patient presents with:    . New Patient (Initial Visit)     never had a SS. she describes sleeping through the night but not waking up feeling well rested, but. states 1-2 times per week she wakes up with headaches.      HISTORY OF PRESENT ILLNESS:  Sharon Robertson is a 49 year old  Caucasian female patient and seen here on 07/04/2020 in a RV ; She also underwent a paced baseline polysomnography on 06 March 2020 report at the time by Dr. Janeece Agee nurse practitioner.  She actually was seen by [facility] and that should be her referral source.  She has a brother who has been using CPAP and carries a diagnosis of OSA.  The patient's Epworth Sleepiness Scale was endorsed at 10 points, she reached an AHI of 12.9 REM AHI was 13.8 in supine sleep there was a significant accentuation to her age AHI of 55 events per hour she also had some periodic limb movements.  She was invited to return for a CPAP titration study which followed on 05 Apr 2020 here she responded excellent to positive airway pressure medium size Simplus full facemask was used and a CPAP machine prescribed for pressures encompassing the most effective pressures of 10, 12 and 13 cmH2O.  She is now here presenting the results of 90 days on auto CPAP she has used the machine 83% of the time she does still work some nights in those nights have to be examined from her compliance record her average user time is 4 hours and 16 minutes which still needs some help.  She has a AHI of 1.9 now a significant decrease in comparison to her baseline central apneas [place] 0.3, obstructive 0.2/h and unknown apnea score 0.1 the unknown seem to be related to air leakage.   The 95th percentile pressure is 12.8 and well within the current settings. She has been placed on a FFM , a Simplus in small size. She has not felt ever comfortable with this FFM and was surprised to have been given it without any smaller ones being tried.    Sleep medicine consultation. Chief concern according to patient : I just have no restful restorative sleep.   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 She  has a past medical history of Asthma, Hyperlipemia, and Hypertension.. Super obesity, shortness of breath. She has received both Covid 19 vaccines.    Sleep relevant medical history: sleep walking in childhood, still in stressful situation-  No other Parasomnia  , adenoid- but not Tonsillectomy, she has looked into weight ,osss surgery but was psychologically not chosen.  Family medical /sleep history:  brother on CPAP with OSA.    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. She lives in a household alone.  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  Tobacco use; never .  ETOH use; never ,  Caffeine intake in form of Coffee( 1 cup I AM ) Soda( none )  Tea ( quit) , nor energy drinks. Regular exercise in form of walking/ swimming .   paradoxical response to melatonin =Insomnia.   Sleep habits are as follows:  The patient's dinner time is between 6 PM. The patient goes to bed at 9.30 PM and is mostly asleep by 10 PM, continues to sleep for 3 hours, wakes for one bathroom break, the first time at 1 AM.   The preferred sleep position is on her side, with the support of 2 pillows. No neck pain. Dreams are reportedly frequent/vivid- for the last 6 weeks. 5.30 AM is the usual rise time. The patient wakes up spontaneously- .  She reports not feeling refreshed or restored in AM, with symptoms such as dry mouth , morning  headaches about twice a week- dull , right temple-not associated with  nausea. , and residual fatigue. Naps are taken seldomly . Power naps are refreshing, but hard to do.    Review of Systems: Out of a complete 14 system review, the patient complains of only the following symptoms, and all other reviewed systems are negative.:  Fatigue, sleepiness , snoring, fragmented sleep, Insomnia   Panic attacks on FFM>    How likely are you to doze in the following situations: 0 = not likely, 1 = slight chance, 2 = moderate chance, 3 = high chance   Sitting and Reading? Watching Television? Sitting inactive in a public place (theater or meeting)? As a passenger in a car for an hour without a break? Lying down in the afternoon when circumstances permit? Sitting and talking to someone? Sitting quietly after lunch without alcohol? In a car, while stopped for a few minutes in traffic?   Total = 4 , down from 10 / 24 points   FSS endorsed at 27 from 45/ 63 points.  CPAP helps   Social History   Socioeconomic History  . Marital status: Single    Spouse name: Not on file  . Number of children: Not on file  . Years of education: Not on file  . Highest education level: Not on file  Occupational History  . Occupation: Designer, jewellery  Tobacco Use  . Smoking status: Never Smoker  . Smokeless tobacco: Never Used  Vaping Use  . Vaping Use: Never used  Substance and Sexual Activity  . Alcohol use: Never  . Drug use: Never  . Sexual activity: Not on file  Other Topics Concern  . Not on file  Social History Narrative  . Not on file   Social Determinants of Health   Financial Resource Strain: Low Risk   . Difficulty of Paying Living Expenses: Not hard at all  Food Insecurity: No Food Insecurity  . Worried About Programme researcher, broadcasting/film/video in the Last Year: Never true  . Ran Out of Food in the Last Year: Never true  Transportation Needs: No Transportation Needs  . Lack of Transportation  (Medical): No  . Lack of Transportation (Non-Medical): No  Physical Activity: Insufficiently Active  . Days of Exercise per Week: 3 days  . Minutes of Exercise per Session: 30 min  Stress: No Stress Concern Present  . Feeling of Stress : Not at all  Social Connections: Unknown  . Frequency of Communication with Friends and Family: Three times a week  . Frequency of Social Gatherings with Friends and Family: Twice a week  . Attends Religious Services: Patient refused  . Active Member of Clubs or Organizations: Patient refused  . Attends Banker  Meetings: Patient refused  . Marital Status: Patient refused    Family History  Problem Relation Age of Onset  . Diabetes Mother   . Heart disease Mother   . Hyperlipidemia Mother   . Hypertension Mother   . Obesity Mother   . Cancer Father        prostate  . Diabetes Father   . Hyperlipidemia Father   . Hypertension Father   . Obesity Father   . Heart disease Brother   . Heart disease Maternal Grandmother   . Diabetes Maternal Grandmother   . Heart disease Maternal Grandfather   . Heart disease Paternal Grandmother   . Heart disease Paternal Grandfather     Past Medical History:  Diagnosis Date  . Asthma   . Hyperlipemia   . Hypertension     Past Surgical History:  Procedure Laterality Date  . ADENOIDECTOMY     childhood     Current Outpatient Medications on File Prior to Visit  Medication Sig Dispense Refill  . atorvastatin (LIPITOR) 10 MG tablet Take 1 tablet (10 mg total) by mouth daily. 90 tablet 3  . cetirizine (ZYRTEC) 10 MG tablet Take 10 mg by mouth daily.    . chlorthalidone (HYGROTON) 25 MG tablet TAKE 1/2 TABLET BY MOUTH EVERY DAY 90 tablet 3  . lisinopril (ZESTRIL) 10 MG tablet TAKE 1 TABLET BY MOUTH EVERY DAY 30 tablet 0  . melatonin 5 MG TABS Take 5 mg by mouth at bedtime.    . metFORMIN (GLUCOPHAGE) 500 MG tablet Take 1 tablet (500 mg total) by mouth in the morning and at bedtime. 60 tablet 0   . rOPINIRole (REQUIP) 0.25 MG tablet TAKE 1 TABLET (0.25 MG TOTAL) BY MOUTH AT BEDTIME. 90 tablet 1  . Vitamin D, Ergocalciferol, (DRISDOL) 1.25 MG (50000 UNIT) CAPS capsule Take 1 capsule (50,000 Units total) by mouth every 7 (seven) days. 4 capsule 0   No current facility-administered medications on file prior to visit.    No Known Allergies  Physical exam:  Today's Vitals   07/04/20 1534  BP: 123/78  Pulse: (!) 126  Weight: (!) 386 lb (175.1 kg)  Height: 5\' 9"  (1.753 m)   Body mass index is 57 kg/m.   Wt Readings from Last 3 Encounters:  07/04/20 (!) 386 lb (175.1 kg)  06/20/20 (!) 383 lb (173.7 kg)  06/06/20 (!) 386 lb (175.1 kg)     Ht Readings from Last 3 Encounters:  07/04/20 5\' 9"  (1.753 m)  06/20/20 5\' 9"  (1.753 m)  06/06/20 5\' 9"  (1.753 m)      General: The patient is awake, alert and appears not in acute distress. The patient is well groomed. Head: Normocephalic, atraumatic. Neck is supple. Mallampati 3-4 , she has a short, sharp angled airway and lateral crowding,   neck circumference:17 inches . Nasal airflow patent.  Retrognathia is not seen.  Dental status: intact  Cardiovascular:   Regular rate and cardiac rhythm by pulse,  without distended neck veins. Respiratory: Lungs are clear to auscultation.  Skin:  Without evidence of ankle edema, or rash. Trunk: The patient's posture is erect.   Neurologic exam : The patient is awake and alert, oriented to place and time.   Memory subjective described as intact.  Attention span & concentration ability appears normal.  Speech is fluent,  without  dysarthria, dysphonia or aphasia.  Mood and affect are appropriate.   Cranial nerves: no loss of smell or taste reported  Pupils are  equal and briskly reactive to light. Funduscopic exam deferred.   Facial sensation intact to fine touch.  Facial motor strength is symmetric and tongue and the short uvula move midline.  Neck ROM : rotation, tilt and flexion  extension were normal for age and shoulder shrug was symmetrical.    Motor exam:  Symmetric bulk, tone and ROM.   Sensory:  Fine touch and vibration were  normal.  Deep tendon reflexes: in the  upper and lower extremities are symmetrically atenuated  and intact.  Babinski response was deferred.       After spending a total time of 21 minutes face to face an in time for physical and neurologic examination, review of laboratory studies,  personal review of imaging studies, reports and results of other testing and review of referral information / records as far as provided in visit, I have established the following assessments:  1) morning headaches, non restorative sleep, high degree of fatigue and elevated sleepiness have resoled  .  2) loud snoring has resolved - she did mention problems with FFM fit and likes to change.  3) Super obesity- BMI 60.99 - this is her main risk factor ofr OSA with hypoventilation.  4) night time work Friday and Saturday for 12 years/ will end now - she will remain in daytime work. ,    I would like to thank Dr Helane RimaErica Wallace , DO , for allowing me to meet with and to take care of this pleasant patient.   will change the interface to a nasal cradle or pillow mask. I will order a fitting for a bella swift.   I plan to follow up either personally or through our NP within 6 month and urge the patient to improve her compliance. .   CC: I will share my notes with PCP, CC Finis BudErika Wallace , DO. Marland Kitchen.  Electronically signed by: Melvyn Novasarmen Shajuan Musso, MD 07/04/2020 3:38 PM  [facility] Board certified by The [employer] of Sleep Medicine and Diplomate of the [employer] of [employer] Medicine. Board certified In Neurology through the ABPN, Fellow of the [employer] of Neu[employer]. Medical Director of [facility].

## 2020-07-04 NOTE — Patient Instructions (Signed)
Sleep Apnea Sleep apnea is a condition in which breathing pauses or becomes shallow during sleep. Episodes of sleep apnea usually last 10 seconds or longer, and they may occur as many as 20 times an hour. Sleep apnea disrupts your sleep and keeps your body from getting the rest that it needs. This condition can increase your risk of certain health problems, including:  Heart attack.  Stroke.  Obesity.  Diabetes.  Heart failure.  Irregular heartbeat. What are the causes? There are three kinds of sleep apnea:  Obstructive sleep apnea. This kind is caused by a blocked or collapsed airway.  Central sleep apnea. This kind happens when the part of the brain that controls breathing does not send the correct signals to the muscles that control breathing.  Mixed sleep apnea. This is a combination of obstructive and central sleep apnea. The most common cause of this condition is a collapsed or blocked airway. An airway can collapse or become blocked if:  Your throat muscles are abnormally relaxed.  Your tongue and tonsils are larger than normal.  You are overweight.  Your airway is smaller than normal. What increases the risk? You are more likely to develop this condition if you:  Are overweight.  Smoke.  Have a smaller than normal airway.  Are elderly.  Are female.  Drink alcohol.  Take sedatives or tranquilizers.  Have a family history of sleep apnea. What are the signs or symptoms? Symptoms of this condition include:  Trouble staying asleep.  Daytime sleepiness and tiredness.  Irritability.  Loud snoring.  Morning headaches.  Trouble concentrating.  Forgetfulness.  Decreased interest in sex.  Unexplained sleepiness.  Mood swings.  Personality changes.  Feelings of depression.  Waking up often during the night to urinate.  Dry mouth.  Sore throat. How is this diagnosed? This condition may be diagnosed with:  A medical history.  A physical  exam.  A series of tests that are done while you are sleeping (sleep study). These tests are usually done in a sleep lab, but they may also be done at home. How is this treated? Treatment for this condition aims to restore normal breathing and to ease symptoms during sleep. It may involve managing health issues that can affect breathing, such as high blood pressure or obesity. Treatment may include:  Sleeping on your side.  Using a decongestant if you have nasal congestion.  Avoiding the use of depressants, including alcohol, sedatives, and narcotics.  Losing weight if you are overweight.  Making changes to your diet.  Quitting smoking.  Using a device to open your airway while you sleep, such as: ? An oral appliance. This is a custom-made mouthpiece that shifts your lower jaw forward. ? A continuous positive airway pressure (CPAP) device. This device blows air through a mask when you breathe out (exhale). ? A nasal expiratory positive airway pressure (EPAP) device. This device has valves that you put into each nostril. ? A bi-level positive airway pressure (BPAP) device. This device blows air through a mask when you breathe in (inhale) and breathe out (exhale).  Having surgery if other treatments do not work. During surgery, excess tissue is removed to create a wider airway. It is important to get treatment for sleep apnea. Without treatment, this condition can lead to:  High blood pressure.  Coronary artery disease.  In men, an inability to achieve or maintain an erection (impotence).  Reduced thinking abilities. Follow these instructions at home: Lifestyle  Make any lifestyle changes   that your health care provider recommends.  Eat a healthy, well-balanced diet.  Take steps to lose weight if you are overweight.  Avoid using depressants, including alcohol, sedatives, and narcotics.  Do not use any products that contain nicotine or tobacco, such as cigarettes,  e-cigarettes, and chewing tobacco. If you need help quitting, ask your health care provider. General instructions  Take over-the-counter and prescription medicines only as told by your health care provider.  If you were given a device to open your airway while you sleep, use it only as told by your health care provider.  If you are having surgery, make sure to tell your health care provider you have sleep apnea. You may need to bring your device with you.  Keep all follow-up visits as told by your health care provider. This is important. Contact a health care provider if:  The device that you received to open your airway during sleep is uncomfortable or does not seem to be working.  Your symptoms do not improve.  Your symptoms get worse. Get help right away if:  You develop: ? Chest pain. ? Shortness of breath. ? Discomfort in your back, arms, or stomach.  You have: ? Trouble speaking. ? Weakness on one side of your body. ? Drooping in your face. These symptoms may represent a serious problem that is an emergency. Do not wait to see if the symptoms will go away. Get medical help right away. Call your local emergency services (911 in the [place] Do not drive yourself to the hospital. Summary  Sleep apnea is a condition in which breathing pauses or becomes shallow during sleep.  The most common cause is a collapsed or blocked airway.  The goal of treatment is to restore normal breathing and to ease symptoms during sleep. This information is not intended to replace advice given to you by your health care provider. Make sure you discuss any questions you have with your health care provider. Document Revised: 05/04/2019 Document Reviewed: 07/13/2018 Elsevier Patient Education  2020 Elsevier Inc.  

## 2020-07-06 ENCOUNTER — Other Ambulatory Visit (INDEPENDENT_AMBULATORY_CARE_PROVIDER_SITE_OTHER): Payer: Self-pay | Admitting: Adult Health

## 2020-07-06 DIAGNOSIS — E559 Vitamin D deficiency, unspecified: Secondary | ICD-10-CM

## 2020-07-06 DIAGNOSIS — R7303 Prediabetes: Secondary | ICD-10-CM

## 2020-07-09 ENCOUNTER — Ambulatory Visit (INDEPENDENT_AMBULATORY_CARE_PROVIDER_SITE_OTHER): Payer: BC Managed Care – PPO | Admitting: Adult Health

## 2020-07-11 ENCOUNTER — Encounter (INDEPENDENT_AMBULATORY_CARE_PROVIDER_SITE_OTHER): Payer: Self-pay

## 2020-07-12 ENCOUNTER — Encounter (INDEPENDENT_AMBULATORY_CARE_PROVIDER_SITE_OTHER): Payer: Self-pay | Admitting: Family Medicine

## 2020-07-12 ENCOUNTER — Other Ambulatory Visit: Payer: Self-pay

## 2020-07-12 ENCOUNTER — Ambulatory Visit (INDEPENDENT_AMBULATORY_CARE_PROVIDER_SITE_OTHER): Payer: BC Managed Care – PPO | Admitting: Family Medicine

## 2020-07-12 VITALS — BP 130/84 | HR 104 | Temp 98.0°F | Ht 69.0 in | Wt 382.0 lb

## 2020-07-12 DIAGNOSIS — Z9189 Other specified personal risk factors, not elsewhere classified: Secondary | ICD-10-CM

## 2020-07-12 DIAGNOSIS — E559 Vitamin D deficiency, unspecified: Secondary | ICD-10-CM

## 2020-07-12 DIAGNOSIS — R7303 Prediabetes: Secondary | ICD-10-CM | POA: Diagnosis not present

## 2020-07-12 DIAGNOSIS — I10 Essential (primary) hypertension: Secondary | ICD-10-CM | POA: Diagnosis not present

## 2020-07-12 DIAGNOSIS — Z6841 Body Mass Index (BMI) 40.0 and over, adult: Secondary | ICD-10-CM

## 2020-07-12 MED ORDER — METFORMIN HCL 500 MG PO TABS: 500.0000 mg | ORAL_TABLET | Freq: Two times a day (BID) | ORAL | 0 refills | Status: DC

## 2020-07-12 MED ORDER — LISINOPRIL 10 MG PO TABS: 10.0000 mg | ORAL_TABLET | Freq: Every day | ORAL | 0 refills | Status: DC

## 2020-07-12 MED ORDER — VITAMIN D (ERGOCALCIFEROL) 1.25 MG (50000 UNIT) PO CAPS: 50000.0000 [IU] | ORAL_CAPSULE | ORAL | 0 refills | Status: DC

## 2020-07-16 NOTE — Progress Notes (Signed)
Chief Complaint:   OBESITY Sharon Robertson is here to discuss her progress with her obesity treatment plan along with follow-up of her obesity related diagnoses. Sharon Robertson is on keeping a food journal and adhering to recommended goals of 1800 calories and 120+ grams of protein daily and states she is following her eating plan approximately 100% of the time. Sharon Robertson states she is swimming for 45 minutes 3-4 times per week.  Today's visit was #: 9 Starting weight: 410 lbs Starting date: 02/09/2020 Today's weight: 382 lbs Today's date: 07/12/2020 Total lbs lost to date: 28 Total lbs lost since last in-office visit: 1  Interim History: Sharon Robertson so far has been hitting her protein and calorie goals 6 days per week. She has occasionally been adding a protein shake to hit protein goal. She denies hunger or cravings. She has made plans to start a kickboxing class with a colleague and will join [employer].  Subjective:   1. Essential hypertension Carolin's blood pressure is controlled today. She denies chest pain, chest pressure, or headaches.  2. Vitamin D deficiency Cherrise denies nausea, vomiting, or muscle weakness, but she notes fatigue. She is on prescription Vit D.  3. Pre-diabetes Sharon Robertson's last A1c was 6.0 and insulin 13.6. She is on metformin and denies GI side effects.  4. At risk for heart disease Sharon Robertson is at a higher than average risk for cardiovascular disease due to obesity.   Assessment/Plan:   1. Essential hypertension Sharon Robertson is working on healthy weight loss and exercise to improve blood pressure control. We will watch for signs of hypotension as she continues her lifestyle modifications. We will refill lisinopril 10 mg PO daily for 1 month.  - lisinopril (ZESTRIL) 10 MG tablet; Take 1 tablet (10 mg total) by mouth daily.  Dispense: 30 tablet; Refill: 0  2. Vitamin D deficiency Low Vitamin D level contributes to fatigue and are associated with obesity, breast, and colon cancer. We will refill  prescription Vitamin D for 1 month. Sharon Robertson will follow-up for routine testing of Vitamin D, at least 2-3 times per year to avoid over-replacement.  - Vitamin D, Ergocalciferol, (DRISDOL) 1.25 MG (50000 UNIT) CAPS capsule; Take 1 capsule (50,000 Units total) by mouth every 7 (seven) days.  Dispense: 4 capsule; Refill: 0  3. Pre-diabetes Sharon Robertson will continue to work on weight loss, exercise, and decreasing simple carbohydrates to help decrease the risk of diabetes. We will refill metformin for 1 month.  - metFORMIN (GLUCOPHAGE) 500 MG tablet; Take 1 tablet (500 mg total) by mouth in the morning and at bedtime.  Dispense: 60 tablet; Refill: 0  4. At risk for heart disease Sharon Robertson was given approximately 15 minutes of coronary artery disease prevention counseling today. She is 49 y.o. female and has risk factors for heart disease including obesity. We discussed intensive lifestyle modifications today with an emphasis on specific weight loss instructions and strategies.   Repetitive spaced learning was employed today to elicit superior memory formation and behavioral change.  5. Class 3 severe obesity with serious comorbidity and body mass index (BMI) of 50.0 to 59.9 in adult, unspecified obesity type (HCC) Sharon Robertson is currently in the action stage of change. As such, her goal is to continue with weight loss efforts. She has agreed to keeping a food journal and adhering to recommended goals of 1800 calories and 120+ grams of protein daily.   Exercise goals: As is.  Behavioral modification strategies: increasing lean protein intake, better snacking choices, planning for success and  keeping a strict food journal.  Sharon Robertson has agreed to follow-up with our clinic in 2 to 3 weeks. She was informed of the importance of frequent follow-up visits to maximize her success with intensive lifestyle modifications for her multiple health conditions.   Objective:   Blood pressure 130/84, pulse (!) 104, temperature 98 F  (36.7 C), temperature source Oral, height 5\' 9"  (1.753 m), weight (!) 382 lb (173.3 kg), last menstrual period 07/09/2020, SpO2 97 %. Body mass index is 56.41 kg/m.  General: Cooperative, alert, well developed, in no acute distress. HEENT: Conjunctivae and lids unremarkable. Cardiovascular: Regular rhythm.  Lungs: Normal work of breathing. Neurologic: No focal deficits.   Lab Results  Component Value Date   CREATININE 0.59 05/23/2020   BUN 19 05/23/2020   NA 137 05/23/2020   K 4.5 05/23/2020   CL 101 05/23/2020   CO2 23 05/23/2020   Lab Results  Component Value Date   ALT 19 05/23/2020   AST 16 05/23/2020   ALKPHOS 90 05/23/2020   BILITOT 0.3 05/23/2020   Lab Results  Component Value Date   HGBA1C 6.0 (H) 05/23/2020   HGBA1C 5.9 (H) 01/04/2020   HGBA1C 5.9 (H) 07/05/2019   HGBA1C 5.6 03/20/2018   Lab Results  Component Value Date   INSULIN 13.6 05/23/2020   Lab Results  Component Value Date   TSH 2.410 05/23/2020   Lab Results  Component Value Date   CHOL 173 05/23/2020   HDL 44 05/23/2020   LDLCALC 112 (H) 05/23/2020   TRIG 92 05/23/2020   CHOLHDL 4.2 01/04/2020   Lab Results  Component Value Date   WBC 8.5 05/23/2020   HGB 12.2 05/23/2020   HCT 38.9 05/23/2020   MCV 83 05/23/2020   PLT 396 05/23/2020   Lab Results  Component Value Date   IRON 93 02/09/2020   TIBC 363 02/09/2020   FERRITIN 42 02/09/2020   Attestation Statements:   Reviewed by clinician on day of visit: allergies, medications, problem list, medical history, surgical history, family history, social history, and previous encounter notes.   I, 04/10/2020, am acting as transcriptionist for Burt Knack, MD.  I have reviewed the above documentation for accuracy and completeness, and I agree with the above. - Reuben Likes, MD

## 2020-07-18 ENCOUNTER — Encounter: Payer: Self-pay | Admitting: Neurology

## 2020-07-18 ENCOUNTER — Other Ambulatory Visit: Payer: Self-pay | Admitting: Family Medicine

## 2020-07-18 DIAGNOSIS — Z1231 Encounter for screening mammogram for malignant neoplasm of breast: Secondary | ICD-10-CM

## 2020-07-28 ENCOUNTER — Other Ambulatory Visit (INDEPENDENT_AMBULATORY_CARE_PROVIDER_SITE_OTHER): Payer: Self-pay | Admitting: Adult Health

## 2020-07-28 DIAGNOSIS — I10 Essential (primary) hypertension: Secondary | ICD-10-CM

## 2020-08-02 ENCOUNTER — Ambulatory Visit (INDEPENDENT_AMBULATORY_CARE_PROVIDER_SITE_OTHER): Payer: BC Managed Care – PPO | Admitting: Family Medicine

## 2020-08-02 ENCOUNTER — Encounter (INDEPENDENT_AMBULATORY_CARE_PROVIDER_SITE_OTHER): Payer: Self-pay | Admitting: Family Medicine

## 2020-08-02 ENCOUNTER — Other Ambulatory Visit: Payer: Self-pay

## 2020-08-02 VITALS — BP 112/77 | HR 88 | Temp 97.6°F | Ht 69.0 in | Wt 380.0 lb

## 2020-08-02 DIAGNOSIS — Z9189 Other specified personal risk factors, not elsewhere classified: Secondary | ICD-10-CM

## 2020-08-02 DIAGNOSIS — Z1231 Encounter for screening mammogram for malignant neoplasm of breast: Secondary | ICD-10-CM

## 2020-08-02 DIAGNOSIS — R7303 Prediabetes: Secondary | ICD-10-CM

## 2020-08-02 DIAGNOSIS — Z6841 Body Mass Index (BMI) 40.0 and over, adult: Secondary | ICD-10-CM

## 2020-08-02 DIAGNOSIS — I10 Essential (primary) hypertension: Secondary | ICD-10-CM

## 2020-08-02 DIAGNOSIS — E785 Hyperlipidemia, unspecified: Secondary | ICD-10-CM

## 2020-08-02 DIAGNOSIS — E559 Vitamin D deficiency, unspecified: Secondary | ICD-10-CM

## 2020-08-02 MED ORDER — LISINOPRIL 10 MG PO TABS: 10.0000 mg | ORAL_TABLET | Freq: Every day | ORAL | 0 refills | Status: DC

## 2020-08-02 MED ORDER — METFORMIN HCL 500 MG PO TABS: 500.0000 mg | ORAL_TABLET | Freq: Two times a day (BID) | ORAL | 0 refills | Status: DC

## 2020-08-02 MED ORDER — VITAMIN D (ERGOCALCIFEROL) 1.25 MG (50000 UNIT) PO CAPS: 50000.0000 [IU] | ORAL_CAPSULE | ORAL | 0 refills | Status: DC

## 2020-08-02 MED ORDER — CHLORTHALIDONE 25 MG PO TABS: 12.5000 mg | ORAL_TABLET | Freq: Every day | ORAL | 0 refills | Status: DC

## 2020-08-02 MED ORDER — ATORVASTATIN CALCIUM 10 MG PO TABS: 10.0000 mg | ORAL_TABLET | Freq: Every day | ORAL | 0 refills | Status: DC

## 2020-08-05 ENCOUNTER — Other Ambulatory Visit (INDEPENDENT_AMBULATORY_CARE_PROVIDER_SITE_OTHER): Payer: Self-pay | Admitting: Family Medicine

## 2020-08-05 DIAGNOSIS — E559 Vitamin D deficiency, unspecified: Secondary | ICD-10-CM

## 2020-08-07 ENCOUNTER — Other Ambulatory Visit (INDEPENDENT_AMBULATORY_CARE_PROVIDER_SITE_OTHER): Payer: Self-pay | Admitting: Family Medicine

## 2020-08-07 DIAGNOSIS — R7303 Prediabetes: Secondary | ICD-10-CM

## 2020-08-07 NOTE — Progress Notes (Signed)
Chief Complaint:   OBESITY Sharon Robertson is here to discuss her progress with her obesity treatment plan along with follow-up of her obesity related diagnoses. Sharon Robertson is on keeping a food journal and adhering to recommended goals of 1800 calories and 120+ grams of protein daily and states she is following her eating plan approximately 100% of the time. Sharon Robertson states she is swimming for 45 minutes 4 times per week, and walking for 30 minutes 2 times per week.  Today's visit was #: 10 Starting weight: 410 lbs Starting date: 02/09/2020 Today's weight: 380 lbs Today's date: 08/02/2020 Total lbs lost to date: 30 Total lbs lost since last in-office visit: 2  Interim History: Sharon Robertson has added in protein in form of fish, protein bars, or steak. She hit a goal of 120 grams of protein per day. She is still hitting 1800 calories per day. She is going to see her nephews over Labor Day weekend, one lives in [place], and one lives in [place].  Subjective:   1. Essential hypertension Sharon Robertson's blood pressure is well controlled, but slightly lower than it was previously. She denies chest pain, chest pressure, or headache.  2. Vitamin D deficiency Sharon Robertson is on prescription Vit D. Last Vit D level was 31.9. She notes some significant improvement in energy with Vit D.  3. Hyperlipidemia, unspecified hyperlipidemia type Sharon Robertson is on Lipitor, and she denies myalgias. Her transaminases were within normal limits. Last LDL was 112, HDL 44, and triglycerides 92.  4. Pre-diabetes Sharon Robertson's last A1c was 6.0 and insulin 13.6. She is on metformin, and she denies GI side effects.  5. At increased risk of exposure to COVID-19 virus The patient is at higher risk of COVID-19 infection due to traveling to [facility] and [place] over the weekend.  Assessment/Plan:   1. Essential hypertension Sharon Robertson is working on healthy weight loss and exercise to improve blood pressure control. We will watch for signs of hypotension as she  continues her lifestyle modifications. We will refill both chlorthalidone and lisinopril for 1 month.  - chlorthalidone (HYGROTON) 25 MG tablet; Take 0.5 tablets (12.5 mg total) by mouth daily.  Dispense: 45 tablet; Refill: 0 - lisinopril (ZESTRIL) 10 MG tablet; Take 1 tablet (10 mg total) by mouth daily.  Dispense: 30 tablet; Refill: 0  2. Vitamin D deficiency Low Vitamin D level contributes to fatigue and are associated with obesity, breast, and colon cancer. We will refill prescription Vitamin D for 1 month. Sharon Robertson will follow-up for routine testing of Vitamin D, at least 2-3 times per year to avoid over-replacement.  - Vitamin D, Ergocalciferol, (DRISDOL) 1.25 MG (50000 UNIT) CAPS capsule; Take 1 capsule (50,000 Units total) by mouth every 7 (seven) days.  Dispense: 4 capsule; Refill: 0  3. Hyperlipidemia, unspecified hyperlipidemia type Cardiovascular risk and specific lipid/LDL goals reviewed. We discussed several lifestyle modifications today and Sharon Robertson will continue to work on diet, exercise and weight loss efforts. We will refill Lipitor for 1 month. Orders and follow up as documented in patient record.   Counseling Intensive lifestyle modifications are the first line treatment for this issue. . Dietary changes: Increase soluble fiber. Decrease simple carbohydrates. . Exercise changes: Moderate to vigorous-intensity aerobic activity 150 minutes per week if tolerated. . Lipid-lowering medications: see documented in medical record.  - atorvastatin (LIPITOR) 10 MG tablet; Take 1 tablet (10 mg total) by mouth daily.  Dispense: 90 tablet; Refill: 0  4. Pre-diabetes Sharon Robertson will continue to work on weight loss, exercise, and  decreasing simple carbohydrates to help decrease the risk of diabetes. We will refill metformin for 1 month.  - metFORMIN (GLUCOPHAGE) 500 MG tablet; Take 1 tablet (500 mg total) by mouth in the morning and at bedtime.  Dispense: 60 tablet; Refill: 0  5. At increased  risk of exposure to COVID-19 virus Sharon Robertson was given approximately 15 minutes of COVID prevention counseling today Counseling  COVID-19 is a respiratory infection that is caused by a virus. It can cause serious infections, such as pneumonia, acute respiratory distress syndrome, acute respiratory failure, or sepsis.  You are more likely to develop a serious illness if you are 76 years of age or older, have a weak immune system, live in a nursing home, have chronic disease, or have obesity.  Get vaccinated as soon as they are available to you.  For our most current information, please visit [email or web address]  Wash your hands often with soap and water for 20 seconds. If soap and water are not available, use alcohol-based hand sanitizer.  Wear a face mask. Make sure your mask covers your nose and mouth.  Maintain at least 6 feet distance from others when in public.  Get help right away if  You have trouble breathing, chest pain, confusion, or other concerning symptoms.  Repetitive spaced learning was employed today to elicit superior memory formation and behavioral change.  6. Class 3 severe obesity with serious comorbidity and body mass index (BMI) of 50.0 to 59.9 in adult, unspecified obesity type (HCC) Sharon Robertson is currently in the action stage of change. As such, her goal is to continue with weight loss efforts. She has agreed to keeping a food journal and adhering to recommended goals of 1800 calories and 120+ grams of protein daily.   Exercise goals: All adults should avoid inactivity. Some physical activity is better than none, and adults who participate in any amount of physical activity gain some health benefits.  Behavioral modification strategies: increasing lean protein intake, increasing vegetables, meal planning and cooking strategies, keeping healthy foods in the home and planning for success.  Sharon Robertson has agreed to follow-up with our clinic in 2 weeks. She was informed of  the importance of frequent follow-up visits to maximize her success with intensive lifestyle modifications for her multiple health conditions.   Objective:   Blood pressure 112/77, pulse 88, temperature 97.6 F (36.4 C), temperature source Oral, height 5\' 9"  (1.753 m), weight (!) 380 lb (172.4 kg), last menstrual period 07/16/2020, SpO2 98 %. Body mass index is 56.12 kg/m.  General: Cooperative, alert, well developed, in no acute distress. HEENT: Conjunctivae and lids unremarkable. Cardiovascular: Regular rhythm.  Lungs: Normal work of breathing. Neurologic: No focal deficits.   Lab Results  Component Value Date   CREATININE 0.59 05/23/2020   BUN 19 05/23/2020   NA 137 05/23/2020   K 4.5 05/23/2020   CL 101 05/23/2020   CO2 23 05/23/2020   Lab Results  Component Value Date   ALT 19 05/23/2020   AST 16 05/23/2020   ALKPHOS 90 05/23/2020   BILITOT 0.3 05/23/2020   Lab Results  Component Value Date   HGBA1C 6.0 (H) 05/23/2020   HGBA1C 5.9 (H) 01/04/2020   HGBA1C 5.9 (H) 07/05/2019   HGBA1C 5.6 03/20/2018   Lab Results  Component Value Date   INSULIN 13.6 05/23/2020   Lab Results  Component Value Date   TSH 2.410 05/23/2020   Lab Results  Component Value Date   CHOL 173 05/23/2020   HDL 44  05/23/2020   LDLCALC 112 (H) 05/23/2020   TRIG 92 05/23/2020   CHOLHDL 4.2 01/04/2020   Lab Results  Component Value Date   WBC 8.5 05/23/2020   HGB 12.2 05/23/2020   HCT 38.9 05/23/2020   MCV 83 05/23/2020   PLT 396 05/23/2020   Lab Results  Component Value Date   IRON 93 02/09/2020   TIBC 363 02/09/2020   FERRITIN 42 02/09/2020   Attestation Statements:   Reviewed by clinician on day of visit: allergies, medications, problem list, medical history, surgical history, family history, social history, and previous encounter notes.   I, Burt Knack, am acting as transcriptionist for Reuben Likes, MD.  I have reviewed the above documentation for accuracy and  completeness, and I agree with the above. - Katherina Mires, MD

## 2020-08-15 ENCOUNTER — Other Ambulatory Visit: Payer: Self-pay

## 2020-08-15 ENCOUNTER — Ambulatory Visit
Admission: RE | Admit: 2020-08-15 | Discharge: 2020-08-15 | Disposition: A | Discharge status: Home / Self Care | Payer: BC Managed Care – PPO | Source: Ambulatory Visit | Attending: Family Medicine | Admitting: Family Medicine

## 2020-08-15 DIAGNOSIS — Z1231 Encounter for screening mammogram for malignant neoplasm of breast: Secondary | ICD-10-CM

## 2020-08-21 ENCOUNTER — Other Ambulatory Visit: Payer: Self-pay

## 2020-08-21 ENCOUNTER — Encounter (INDEPENDENT_AMBULATORY_CARE_PROVIDER_SITE_OTHER): Payer: Self-pay

## 2020-08-21 ENCOUNTER — Encounter (INDEPENDENT_AMBULATORY_CARE_PROVIDER_SITE_OTHER): Payer: Self-pay | Admitting: Family Medicine

## 2020-08-21 ENCOUNTER — Ambulatory Visit (INDEPENDENT_AMBULATORY_CARE_PROVIDER_SITE_OTHER): Payer: BC Managed Care – PPO | Admitting: Family Medicine

## 2020-08-21 VITALS — BP 129/78 | HR 76 | Temp 97.5°F | Ht 69.0 in | Wt 380.0 lb

## 2020-08-21 DIAGNOSIS — I10 Essential (primary) hypertension: Secondary | ICD-10-CM

## 2020-08-21 DIAGNOSIS — E559 Vitamin D deficiency, unspecified: Secondary | ICD-10-CM

## 2020-08-21 DIAGNOSIS — Z9189 Other specified personal risk factors, not elsewhere classified: Secondary | ICD-10-CM

## 2020-08-21 DIAGNOSIS — Z6841 Body Mass Index (BMI) 40.0 and over, adult: Secondary | ICD-10-CM | POA: Diagnosis not present

## 2020-08-21 MED ORDER — LISINOPRIL 10 MG PO TABS: 10.0000 mg | ORAL_TABLET | Freq: Every day | ORAL | 0 refills | Status: DC

## 2020-08-21 MED ORDER — WEGOVY 0.25 MG/0.5ML ~~LOC~~ SOAJ: 0.2500 mg | SUBCUTANEOUS | 0 refills | Status: DC

## 2020-08-23 NOTE — Progress Notes (Signed)
Chief Complaint:   OBESITY Sharon Robertson is here to discuss her progress with her obesity treatment plan along with follow-up of her obesity related diagnoses. Sharon Robertson is on keeping a food journal and adhering to recommended goals of 1800 calories and 120+ grams of protein daily and states she is following her eating plan approximately 100% of the time. Sharon Robertson states she is walking for 45 minutes 5 times per week.  Today's visit was #: 11 Starting weight: 410 lbs Starting date: 02/09/2020 Today's weight: 380 lbs Today's date: 08/21/2020 Total lbs lost to date: 30 Total lbs lost since last in-office visit: 0  Interim History: Sharon Robertson has been walking more, and hitting 120 grams of protein daily. She is eating eggs for breakfast and a protein bar for snack. She denies hunger or cravings. She is staying under 1800 calories consistently, ranging from 1600 to 1650. She voices she is sometimes missing large portion of time journaling secondary to time constraints (this happens 2 times per week).  Subjective:   1. Vitamin D deficiency Sharon Robertson notes fatigue, and she denies nausea, vomiting, or muscle weakness. Last vit D level was 31.9, but not at goal.  2. Essential hypertension Sharon Robertson's blood pressure is controlled today, and she denies side effects of lisinopril. She denies chest pain, chest pressure, or headache.  3. At risk for heart disease Sharon Robertson is at a higher than average risk for cardiovascular disease due to obesity.   Assessment/Plan:   1. Vitamin D deficiency Low Vitamin D level contributes to fatigue and are associated with obesity, breast, and colon cancer. We will refill prescription Vitamin D for 1 month. Sharon Robertson will follow-up for routine testing of Vitamin D, at least 2-3 times per year to avoid over-replacement.  2. Essential hypertension Sharon Robertson is working on healthy weight loss and exercise to improve blood pressure control. We will watch for signs of hypotension as she continues her  lifestyle modifications. We will refill lisinopril for 1 month.  - lisinopril (ZESTRIL) 10 MG tablet; Take 1 tablet (10 mg total) by mouth daily.  Dispense: 30 tablet; Refill: 0  3. At risk for heart disease Sharon Robertson was given approximately 15 minutes of diabetes education and counseling today. We discussed intensive lifestyle modifications today with an emphasis on weight loss as well as increasing exercise and decreasing simple carbohydrates in her diet. We also reviewed medication options with an emphasis on risk versus benefit of those discussed.   Repetitive spaced learning was employed today to elicit superior memory formation and behavioral change.  4. Class 3 severe obesity with serious comorbidity and body mass index (BMI) of 50.0 to 59.9 in adult, unspecified obesity type (HCC) Sharon Robertson is currently in the action stage of change. As such, her goal is to continue with weight loss efforts. She has agreed to keeping a food journal and adhering to recommended goals of 1800 calories and 120+ grams of protein daily.   We discussed various medication options to help Sharon Robertson with her weight loss efforts and we both agreed to start Wegovy 0.25 mg SubQ weekly with no refills. No history of pancreatitis.  - Semaglutide-Weight Management (WEGOVY) 0.25 MG/0.5ML SOAJ; Inject 0.25 mg into the skin once a week.  Dispense: 2 mL; Refill: 0  Exercise goals: All adults should avoid inactivity. Some physical activity is better than none, and adults who participate in any amount of physical activity gain some health benefits.  Behavioral modification strategies: increasing lean protein intake, meal planning and cooking strategies, keeping  healthy foods in the home, planning for success and keeping a strict food journal.  Sharon Robertson has agreed to follow-up with our clinic in 2 weeks. She was informed of the importance of frequent follow-up visits to maximize her success with intensive lifestyle modifications for her  multiple health conditions.   Objective:   Blood pressure 129/78, pulse 76, temperature (!) 97.5 F (36.4 C), temperature source Oral, height 5\' 9"  (1.753 m), weight (!) 380 lb (172.4 kg), SpO2 98 %. Body mass index is 56.12 kg/m.  General: Cooperative, alert, well developed, in no acute distress. HEENT: Conjunctivae and lids unremarkable. Cardiovascular: Regular rhythm.  Lungs: Normal work of breathing. Neurologic: No focal deficits.   Lab Results  Component Value Date   CREATININE 0.59 05/23/2020   BUN 19 05/23/2020   NA 137 05/23/2020   K 4.5 05/23/2020   CL 101 05/23/2020   CO2 23 05/23/2020   Lab Results  Component Value Date   ALT 19 05/23/2020   AST 16 05/23/2020   ALKPHOS 90 05/23/2020   BILITOT 0.3 05/23/2020   Lab Results  Component Value Date   HGBA1C 6.0 (H) 05/23/2020   HGBA1C 5.9 (H) 01/04/2020   HGBA1C 5.9 (H) 07/05/2019   HGBA1C 5.6 03/20/2018   Lab Results  Component Value Date   INSULIN 13.6 05/23/2020   Lab Results  Component Value Date   TSH 2.410 05/23/2020   Lab Results  Component Value Date   CHOL 173 05/23/2020   HDL 44 05/23/2020   LDLCALC 112 (H) 05/23/2020   TRIG 92 05/23/2020   CHOLHDL 4.2 01/04/2020   Lab Results  Component Value Date   WBC 8.5 05/23/2020   HGB 12.2 05/23/2020   HCT 38.9 05/23/2020   MCV 83 05/23/2020   PLT 396 05/23/2020   Lab Results  Component Value Date   IRON 93 02/09/2020   TIBC 363 02/09/2020   FERRITIN 42 02/09/2020   Attestation Statements:   Reviewed by clinician on day of visit: allergies, medications, problem list, medical history, surgical history, family history, social history, and previous encounter notes.   I, 04/10/2020, am acting as transcriptionist for Burt Knack, MD.  I have reviewed the above documentation for accuracy and completeness, and I agree with the above. - Reuben Likes, MD

## 2020-08-28 NOTE — Progress Notes (Signed)
Hi! Not sure why I got this or why it says I was the authorizing physician but wanted you to know  Bennett Scrape

## 2020-08-28 NOTE — Progress Notes (Signed)
Thanks! Not sure what happened either but glad to have a good result.  Rich

## 2020-08-29 ENCOUNTER — Other Ambulatory Visit (INDEPENDENT_AMBULATORY_CARE_PROVIDER_SITE_OTHER): Payer: Self-pay | Admitting: Family Medicine

## 2020-08-29 DIAGNOSIS — I10 Essential (primary) hypertension: Secondary | ICD-10-CM

## 2020-08-31 ENCOUNTER — Other Ambulatory Visit (INDEPENDENT_AMBULATORY_CARE_PROVIDER_SITE_OTHER): Payer: Self-pay | Admitting: Family Medicine

## 2020-08-31 DIAGNOSIS — E559 Vitamin D deficiency, unspecified: Secondary | ICD-10-CM

## 2020-09-02 ENCOUNTER — Other Ambulatory Visit (INDEPENDENT_AMBULATORY_CARE_PROVIDER_SITE_OTHER): Payer: Self-pay | Admitting: Family Medicine

## 2020-09-02 DIAGNOSIS — R7303 Prediabetes: Secondary | ICD-10-CM

## 2020-09-04 ENCOUNTER — Other Ambulatory Visit: Payer: Self-pay

## 2020-09-04 ENCOUNTER — Ambulatory Visit (INDEPENDENT_AMBULATORY_CARE_PROVIDER_SITE_OTHER): Payer: BC Managed Care – PPO | Admitting: Family Medicine

## 2020-09-04 ENCOUNTER — Encounter (INDEPENDENT_AMBULATORY_CARE_PROVIDER_SITE_OTHER): Payer: Self-pay | Admitting: Family Medicine

## 2020-09-04 VITALS — BP 132/82 | HR 97 | Temp 98.0°F | Ht 69.0 in | Wt 376.0 lb

## 2020-09-04 DIAGNOSIS — Z9189 Other specified personal risk factors, not elsewhere classified: Secondary | ICD-10-CM

## 2020-09-04 DIAGNOSIS — E559 Vitamin D deficiency, unspecified: Secondary | ICD-10-CM

## 2020-09-04 DIAGNOSIS — I10 Essential (primary) hypertension: Secondary | ICD-10-CM | POA: Diagnosis not present

## 2020-09-04 DIAGNOSIS — Z6841 Body Mass Index (BMI) 40.0 and over, adult: Secondary | ICD-10-CM

## 2020-09-04 MED ORDER — VITAMIN D (ERGOCALCIFEROL) 1.25 MG (50000 UNIT) PO CAPS: 50000.0000 [IU] | ORAL_CAPSULE | ORAL | 0 refills | Status: DC

## 2020-09-04 NOTE — Progress Notes (Signed)
Chief Complaint:   OBESITY Sharon Robertson is here to discuss her progress with her obesity treatment plan along with follow-up of her obesity related diagnoses. Sharon Robertson is on keeping a food journal and adhering to recommended goals of 1800 calories and 120+ grams of protein and states she is following her eating plan approximately 100% of the time. Sharon Robertson states she is walking for 30 minutes 4 times per week.  Today's visit was #: 12 Starting weight: 410 lbs Starting date: 02/09/2020 Today's weight: 376 lbs Today's date: 09/04/2020 Total lbs lost to date: 34 Total lbs lost since last in-office visit: 4  Interim History: Sharon Robertson is getting closer to 100 calories and staying at 120 grams of protein daily. She denies days of being really off in terms of calories or protein. She is thinking of taking a trip in the upcoming weeks just to get away. Sometimes she does have to push herself to eat.  Subjective:   1. Essential hypertension Sharon Robertson's blood pressure is well controlled. She denies chest pain, chest pressure, or headache.  2. Vitamin D deficiency Sharon Robertson denies nausea, vomiting, or muscle weakness, but notes fatigue. She is on prescription Vit D.  3. At risk for osteoporosis Sharon Robertson is at higher risk of osteopenia and osteoporosis due to Vitamin D deficiency.   Assessment/Plan:   1. Essential hypertension Sharon Robertson will continue her current medications, and will continue working on healthy weight loss and exercise to improve blood pressure control. We will watch for signs of hypotension as she continues her lifestyle modifications.  2. Vitamin D deficiency Low Vitamin D level contributes to fatigue and are associated with obesity, breast, and colon cancer. We will refill prescription Vitamin D for 1 month. Sharon Robertson will follow-up for routine testing of Vitamin D, at least 2-3 times per year to avoid over-replacement.  - Vitamin D, Ergocalciferol, (DRISDOL) 1.25 MG (50000 UNIT) CAPS capsule; Take 1  capsule (50,000 Units total) by mouth every 7 (seven) days.  Dispense: 4 capsule; Refill: 0  3. At risk for osteoporosis Sharon Robertson was given approximately 15 minutes of osteoporosis prevention counseling today. Sharon Robertson is at risk for osteopenia and osteoporosis due to her Vitamin D deficiency. She was encouraged to take her Vitamin D and follow her higher calcium diet and increase strengthening exercise to help strengthen her bones and decrease her risk of osteopenia and osteoporosis.  Repetitive spaced learning was employed today to elicit superior memory formation and behavioral change.  4. Class 3 severe obesity with serious comorbidity and body mass index (BMI) of 50.0 to 59.9 in adult, unspecified obesity type (HCC) Sharon Robertson is currently in the action stage of change. As such, her goal is to continue with weight loss efforts. She has agreed to keeping a food journal and adhering to recommended goals of 1800 calories and 120+ grams of protein daily.   We discussed various medication options to help Sharon Robertson with her weight loss efforts and we both agreed to continue [facility] as is.  Exercise goals: All adults should avoid inactivity. Some physical activity is better than none, and adults who participate in any amount of physical activity gain some health benefits.  Behavioral modification strategies: increasing lean protein intake, increasing vegetables, meal planning and cooking strategies and planning for success.  Sharon Robertson has agreed to follow-up with our clinic in 2 weeks. She was informed of the importance of frequent follow-up visits to maximize her success with intensive lifestyle modifications for her multiple health conditions.   Objective:  Blood pressure 132/82, pulse 97, temperature 98 F (36.7 C), temperature source Oral, height 5\' 9"  (1.753 m), weight (!) 376 lb (170.6 kg), last menstrual period 08/19/2020, SpO2 97 %. Body mass index is 55.53 kg/m.  General: Cooperative, alert, well  developed, in no acute distress. HEENT: Conjunctivae and lids unremarkable. Cardiovascular: Regular rhythm.  Lungs: Normal work of breathing. Neurologic: No focal deficits.   Lab Results  Component Value Date   CREATININE 0.59 05/23/2020   BUN 19 05/23/2020   NA 137 05/23/2020   K 4.5 05/23/2020   CL 101 05/23/2020   CO2 23 05/23/2020   Lab Results  Component Value Date   ALT 19 05/23/2020   AST 16 05/23/2020   ALKPHOS 90 05/23/2020   BILITOT 0.3 05/23/2020   Lab Results  Component Value Date   HGBA1C 6.0 (H) 05/23/2020   HGBA1C 5.9 (H) 01/04/2020   HGBA1C 5.9 (H) 07/05/2019   HGBA1C 5.6 03/20/2018   Lab Results  Component Value Date   INSULIN 13.6 05/23/2020   Lab Results  Component Value Date   TSH 2.410 05/23/2020   Lab Results  Component Value Date   CHOL 173 05/23/2020   HDL 44 05/23/2020   LDLCALC 112 (H) 05/23/2020   TRIG 92 05/23/2020   CHOLHDL 4.2 01/04/2020   Lab Results  Component Value Date   WBC 8.5 05/23/2020   HGB 12.2 05/23/2020   HCT 38.9 05/23/2020   MCV 83 05/23/2020   PLT 396 05/23/2020   Lab Results  Component Value Date   IRON 93 02/09/2020   TIBC 363 02/09/2020   FERRITIN 42 02/09/2020   Attestation Statements:   Reviewed by clinician on day of visit: allergies, medications, problem list, medical history, surgical history, family history, social history, and previous encounter notes.   I, 04/10/2020, am acting as transcriptionist for Burt Knack, MD.  I have reviewed the above documentation for accuracy and completeness, and I agree with the above. - Reuben Likes, MD

## 2020-09-05 ENCOUNTER — Other Ambulatory Visit (INDEPENDENT_AMBULATORY_CARE_PROVIDER_SITE_OTHER): Payer: Self-pay | Admitting: Family Medicine

## 2020-09-05 DIAGNOSIS — R7303 Prediabetes: Secondary | ICD-10-CM

## 2020-09-16 ENCOUNTER — Other Ambulatory Visit (INDEPENDENT_AMBULATORY_CARE_PROVIDER_SITE_OTHER): Payer: Self-pay | Admitting: Family Medicine

## 2020-09-16 DIAGNOSIS — I10 Essential (primary) hypertension: Secondary | ICD-10-CM

## 2020-09-17 ENCOUNTER — Encounter (INDEPENDENT_AMBULATORY_CARE_PROVIDER_SITE_OTHER): Payer: Self-pay

## 2020-09-17 NOTE — Telephone Encounter (Signed)
Message sent to pt-CAS 

## 2020-09-25 ENCOUNTER — Other Ambulatory Visit: Payer: Self-pay

## 2020-09-25 ENCOUNTER — Encounter (INDEPENDENT_AMBULATORY_CARE_PROVIDER_SITE_OTHER): Payer: Self-pay | Admitting: Family Medicine

## 2020-09-25 ENCOUNTER — Ambulatory Visit (INDEPENDENT_AMBULATORY_CARE_PROVIDER_SITE_OTHER): Payer: BC Managed Care – PPO | Admitting: Family Medicine

## 2020-09-25 VITALS — BP 119/76 | HR 104 | Temp 97.9°F | Ht 69.0 in | Wt 373.0 lb

## 2020-09-25 DIAGNOSIS — I10 Essential (primary) hypertension: Secondary | ICD-10-CM | POA: Diagnosis not present

## 2020-09-25 DIAGNOSIS — Z9189 Other specified personal risk factors, not elsewhere classified: Secondary | ICD-10-CM | POA: Diagnosis not present

## 2020-09-25 DIAGNOSIS — E559 Vitamin D deficiency, unspecified: Secondary | ICD-10-CM

## 2020-09-25 DIAGNOSIS — Z6841 Body Mass Index (BMI) 40.0 and over, adult: Secondary | ICD-10-CM | POA: Diagnosis not present

## 2020-09-25 DIAGNOSIS — E66813 Obesity, class 3: Secondary | ICD-10-CM

## 2020-09-25 MED ORDER — VITAMIN D (ERGOCALCIFEROL) 1.25 MG (50000 UNIT) PO CAPS: 50000.0000 [IU] | ORAL_CAPSULE | ORAL | 0 refills | Status: DC

## 2020-09-25 MED ORDER — WEGOVY 0.5 MG/0.5ML ~~LOC~~ SOAJ: 0.5000 mg | SUBCUTANEOUS | 0 refills | Status: DC

## 2020-09-25 MED ORDER — LISINOPRIL 10 MG PO TABS: 10.0000 mg | ORAL_TABLET | Freq: Every day | ORAL | 0 refills | Status: DC

## 2020-09-27 NOTE — Progress Notes (Signed)
Chief Complaint:   OBESITY Sharon Robertson is here to discuss her progress with her obesity treatment plan along with follow-up of her obesity related diagnoses. Sharon Robertson is on keeping a food journal and adhering to recommended goals of 1800 calories and 120+ grams of protein daily and states she is following her eating plan approximately 100% of the time. Sharon Robertson states she is walked 5k 1 time per week, and walking for 30 minutes 4 times per week.  Today's visit was #: 13 Starting weight: 410 lbs Starting date: 02/09/2020 Today's weight: 373 lbs Today's date: 09/25/2020 Total lbs lost to date: 37 Total lbs lost since last in-office visit: 3  Interim History: Sharon Robertson is doing well with journaling. Walked 5k for the first time over the weekend. Has an upcoming job interview. She denies GI side effects of Wegovy. She denies obstacles for seen in the next few weeks. Gotten protein in and has gotten more handheld snacks that can not be refrigerated or heated up.    Subjective:   1. Essential hypertension Sharon Robertson's blood pressure is well controlled today. She denies chest pain, chest pressure, headache, lightheadedness, or dizziness. She is on lisinopril and chlorthalidone.  2. Vitamin D deficiency Sharon Robertson denies nausea, vomiting, or muscle weakness, but notes fatigue. She is on prescription Vit D. Last Vit D level was 31.9.  3. At risk for side effect of medication Sharon Robertson is at risk for drug side effects due to increased dose of Wegovy.  Assessment/Plan:   1. Essential hypertension Sharon Robertson is working on healthy weight loss and exercise to improve blood pressure control. We will watch for signs of hypotension as she continues her lifestyle modifications. We will refill lisinopril for 1 month.  - lisinopril (ZESTRIL) 10 MG tablet; Take 1 tablet (10 mg total) by mouth daily.  Dispense: 30 tablet; Refill: 0  2. Vitamin D deficiency Low Vitamin D level contributes to fatigue and are associated with obesity,  breast, and colon cancer. We will refill prescription Vitamin D for 1 month. Sharon Robertson will follow-up for routine testing of Vitamin D, at least 2-3 times per year to avoid over-replacement.  - Vitamin D, Ergocalciferol, (DRISDOL) 1.25 MG (50000 UNIT) CAPS capsule; Take 1 capsule (50,000 Units total) by mouth every 7 (seven) days.  Dispense: 4 capsule; Refill: 0  3. At risk for side effect of medication Sharon Robertson was given approximately 15 minutes of drug side effect counseling today.  We discussed side effect possibility and risk versus benefits. Sharon Robertson agreed to the medication and will contact this office if these side effects are intolerable.  Sharon Robertson was employed today to elicit superior memory formation and behavioral change.  4. Class 3 severe obesity with serious comorbidity and body mass index (BMI) of 50.0 to 59.9 in adult, unspecified obesity type (HCC) Sharon Robertson is currently in the action stage of change. As such, her goal is to continue with weight loss efforts. She has agreed to keeping a food journal and adhering to recommended goals of 1800 calories and 120+ grams of protein daily.   We discussed various medication options to help Sharon Robertson with her weight loss efforts and we both agreed to increase Wegovy to 0.50 mg SubQ weekly with no refills.  - Semaglutide-Weight Management (WEGOVY) 0.5 MG/0.5ML SOAJ; Inject 0.5 mg into the skin once a week.  Dispense: 2 mL; Refill: 0  Exercise goals: As is.  Behavioral modification strategies: increasing lean protein intake, meal planning and cooking strategies, keeping healthy foods in the  home and keeping a strict food journal.  Sharon Robertson has agreed to follow-up with our clinic in 2 to 3 weeks. She was informed of the importance of frequent follow-up visits to maximize her success with intensive lifestyle modifications for her multiple health conditions.   Objective:   Blood pressure 119/76, pulse (!) 104, temperature 97.9 F (36.6 C),  temperature source Oral, height 5\' 9"  (1.753 m), weight (!) 373 lb (169.2 kg), last menstrual period 09/11/2020, SpO2 98 %. Body mass index is 55.08 kg/m.  General: Cooperative, alert, well developed, in no acute distress. HEENT: Conjunctivae and lids unremarkable. Cardiovascular: Regular rhythm.  Lungs: Normal work of breathing. Neurologic: No focal deficits.   Lab Results  Component Value Date   CREATININE 0.59 05/23/2020   BUN 19 05/23/2020   NA 137 05/23/2020   K 4.5 05/23/2020   CL 101 05/23/2020   CO2 23 05/23/2020   Lab Results  Component Value Date   ALT 19 05/23/2020   AST 16 05/23/2020   ALKPHOS 90 05/23/2020   BILITOT 0.3 05/23/2020   Lab Results  Component Value Date   HGBA1C 6.0 (H) 05/23/2020   HGBA1C 5.9 (H) 01/04/2020   HGBA1C 5.9 (H) 07/05/2019   HGBA1C 5.6 03/20/2018   Lab Results  Component Value Date   INSULIN 13.6 05/23/2020   Lab Results  Component Value Date   TSH 2.410 05/23/2020   Lab Results  Component Value Date   CHOL 173 05/23/2020   HDL 44 05/23/2020   LDLCALC 112 (H) 05/23/2020   TRIG 92 05/23/2020   CHOLHDL 4.2 01/04/2020   Lab Results  Component Value Date   WBC 8.5 05/23/2020   HGB 12.2 05/23/2020   HCT 38.9 05/23/2020   MCV 83 05/23/2020   PLT 396 05/23/2020   Lab Results  Component Value Date   IRON 93 02/09/2020   TIBC 363 02/09/2020   FERRITIN 42 02/09/2020   Attestation Statements:   Reviewed by clinician on day of visit: allergies, medications, problem list, medical history, surgical history, family history, social history, and previous encounter notes.   I, 04/10/2020, am acting as transcriptionist for Burt Knack, MD.  I have reviewed the above documentation for accuracy and completeness, and I agree with the above. - Reuben Likes, MD

## 2020-10-01 ENCOUNTER — Other Ambulatory Visit (INDEPENDENT_AMBULATORY_CARE_PROVIDER_SITE_OTHER): Payer: Self-pay | Admitting: Family Medicine

## 2020-10-01 DIAGNOSIS — E559 Vitamin D deficiency, unspecified: Secondary | ICD-10-CM

## 2020-10-01 NOTE — Telephone Encounter (Signed)
Dr U pt 

## 2020-10-10 ENCOUNTER — Ambulatory Visit (INDEPENDENT_AMBULATORY_CARE_PROVIDER_SITE_OTHER): Payer: BC Managed Care – PPO | Admitting: Family Medicine

## 2020-10-16 ENCOUNTER — Other Ambulatory Visit: Payer: Self-pay

## 2020-10-16 ENCOUNTER — Ambulatory Visit (INDEPENDENT_AMBULATORY_CARE_PROVIDER_SITE_OTHER): Payer: BC Managed Care – PPO | Admitting: Family Medicine

## 2020-10-16 ENCOUNTER — Encounter (INDEPENDENT_AMBULATORY_CARE_PROVIDER_SITE_OTHER): Payer: Self-pay | Admitting: Family Medicine

## 2020-10-16 VITALS — BP 114/77 | HR 81 | Temp 97.5°F | Ht 69.0 in | Wt 370.0 lb

## 2020-10-16 DIAGNOSIS — E559 Vitamin D deficiency, unspecified: Secondary | ICD-10-CM | POA: Diagnosis not present

## 2020-10-16 DIAGNOSIS — Z9189 Other specified personal risk factors, not elsewhere classified: Secondary | ICD-10-CM | POA: Diagnosis not present

## 2020-10-16 DIAGNOSIS — I10 Essential (primary) hypertension: Secondary | ICD-10-CM | POA: Diagnosis not present

## 2020-10-16 DIAGNOSIS — E66813 Obesity, class 3: Secondary | ICD-10-CM

## 2020-10-16 DIAGNOSIS — Z6841 Body Mass Index (BMI) 40.0 and over, adult: Secondary | ICD-10-CM

## 2020-10-16 MED ORDER — SEMAGLUTIDE-WEIGHT MANAGEMENT 1 MG/0.5ML ~~LOC~~ SOAJ: 1.0000 mg | SUBCUTANEOUS | 0 refills | Status: DC

## 2020-10-16 MED ORDER — LISINOPRIL 10 MG PO TABS: 10.0000 mg | ORAL_TABLET | Freq: Every day | ORAL | 0 refills | Status: DC

## 2020-10-16 MED ORDER — CHLORTHALIDONE 25 MG PO TABS: 12.5000 mg | ORAL_TABLET | Freq: Every day | ORAL | 0 refills | Status: DC

## 2020-10-16 MED ORDER — VITAMIN D (ERGOCALCIFEROL) 1.25 MG (50000 UNIT) PO CAPS: 50000.0000 [IU] | ORAL_CAPSULE | ORAL | 0 refills | Status: DC

## 2020-10-18 ENCOUNTER — Encounter (INDEPENDENT_AMBULATORY_CARE_PROVIDER_SITE_OTHER): Payer: Self-pay | Admitting: Family Medicine

## 2020-10-18 NOTE — Progress Notes (Signed)
Chief Complaint:   OBESITY Sharon Robertson is here to discuss her progress with her obesity treatment plan along with follow-up of her obesity related diagnoses. Sharon Robertson is on keeping a food journal and adhering to recommended goals of 1800 calories and 120+ grams of protein daily and states she is following her eating plan approximately 100% of the time. Sharon Robertson states she is walking for 30 minutes 4 times per week.  Today's visit was #: 14 Starting weight: 410 lbs Starting date: 02/09/2020 Today's weight: 370 lbs Today's date: 10/16/2020 Total lbs lost to date: 40 Total lbs lost since last in-office visit: 3  Interim History: Sharon Robertson is on Wegovy 0.50 mg weekly. She feels it is quite beneficial for appetite. She denies nausea or constipation. She is down 40 lbs total since March 2021. She is journaling daily. She is doing much better with meeting her protein goals. She is using protein bars soemtimes due to portability. She is meeting her calories goals. 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   Subjective:   1. Essential hypertension Sharon Robertson's hypertension is very well controlled on lisinopril and chlorthalidone. Cardiovascular ROS: no chest pain or dyspnea on exertion.  BP Readings from Last 3 Encounters:  10/16/20 114/77  09/25/20 119/76  09/04/20 132/82   Lab Results  Component Value Date   CREATININE 0.59 05/23/2020   CREATININE 0.65 07/05/2019   CREATININE 0.70 03/31/2018   2. Vitamin D deficiency Sharon Robertson's last Vit D level was low at 31.9. She is on weekly 50,000 IU Vit D prescription.  3. At risk for side effect of medication Sharon Robertson is at risk for drug side effects due to increased dose of Wegovy.  Assessment/Plan:   1. Essential hypertension  We will refill both chlorthalidone and lisinopril for 1 month.  - chlorthalidone (HYGROTON) 25 MG tablet; Take 0.5 tablets (12.5 mg total) by mouth daily.  Dispense: 45 tablet;  Refill: 0 - lisinopril (ZESTRIL) 10 MG tablet; Take 1 tablet (10 mg total) by mouth daily.  Dispense: 30 tablet; Refill: 0  2. Vitamin D deficiency . We will refill prescription Vitamin D for 1 month. Sharon Robertson will follow-up for routine testing of Vitamin D, at least 2-3 times per year to avoid over-replacement.  - Vitamin D, Ergocalciferol, (DRISDOL) 1.25 MG (50000 UNIT) CAPS capsule; Take 1 capsule (50,000 Units total) by mouth every 7 (seven) days.  Dispense: 4 capsule; Refill: 0  3. At risk for side effect of medication Sharon Robertson was given approximately 15 minutes of drug side effect counseling today due increased dose of Wegovy. . We discussed side effect possibility and risk versus benefits. Sharon Robertson agreed to the medication and will contact this office if these side effects are intolerable.  Repetitive spaced learning was employed today to elicit superior memory formation and behavioral change.  4. Class 3 severe obesity with serious comorbidity and body mass index (BMI) of 50.0 to 59.9 in adult, unspecified obesity type (HCC) Sharon Robertson is currently in the action stage of change. As such, her goal is to continue with weight loss efforts. She has agreed to keeping a food journal and adhering to recommended goals of 1800 calories and 120+ grams of protein daily.   We discussed various medication options to help Sharon Robertson with her weight loss efforts and we both agreed to increase Wegovy to 1.0 mg weekly with no refills.  - Semaglutide-Weight Management 1 MG/0.5ML SOAJ; Inject 1 mg into the skin once  a week.  Dispense: 2 mL; Refill: 0  Exercise goals: As is, add resistance 2-3 days per week.  Behavioral modification strategies: increasing lean protein intake and planning for success.  Sharon Robertson has agreed to follow-up with our clinic in 2 to 3 weeks with Dr. Lawson Radar.   Objective:   Blood pressure 114/77, pulse 81, temperature (!) 97.5 F (36.4 C), height 5\' 9"  (1.753 m), weight (!) 370 lb (167.8 kg), SpO2  97 %. Body mass index is 54.64 kg/m.  General: Cooperative, alert, well developed, in no acute distress. HEENT: Conjunctivae and lids unremarkable. Cardiovascular: Regular rhythm.  Lungs: Normal work of breathing. Neurologic: No focal deficits.   Lab Results  Component Value Date   CREATININE 0.59 05/23/2020   BUN 19 05/23/2020   NA 137 05/23/2020   K 4.5 05/23/2020   CL 101 05/23/2020   CO2 23 05/23/2020   Lab Results  Component Value Date   ALT 19 05/23/2020   AST 16 05/23/2020   ALKPHOS 90 05/23/2020   BILITOT 0.3 05/23/2020   Lab Results  Component Value Date   HGBA1C 6.0 (H) 05/23/2020   HGBA1C 5.9 (H) 01/04/2020   HGBA1C 5.9 (H) 07/05/2019   HGBA1C 5.6 03/20/2018   Lab Results  Component Value Date   INSULIN 13.6 05/23/2020   Lab Results  Component Value Date   TSH 2.410 05/23/2020   Lab Results  Component Value Date   CHOL 173 05/23/2020   HDL 44 05/23/2020   LDLCALC 112 (H) 05/23/2020   TRIG 92 05/23/2020   CHOLHDL 4.2 01/04/2020   Lab Results  Component Value Date   WBC 8.5 05/23/2020   HGB 12.2 05/23/2020   HCT 38.9 05/23/2020   MCV 83 05/23/2020   PLT 396 05/23/2020   Lab Results  Component Value Date   IRON 93 02/09/2020   TIBC 363 02/09/2020   FERRITIN 42 02/09/2020   Attestation Statements:   Reviewed by clinician on day of visit: allergies, medications, problem list, medical history, surgical history, family history, social history, and previous encounter notes.   04/10/2020, am acting as Trude Mcburney for Energy manager, FNP-C.  I have reviewed the above documentation for accuracy and completeness, and I agree with the above. -  [place], FNP

## 2020-11-05 ENCOUNTER — Other Ambulatory Visit (INDEPENDENT_AMBULATORY_CARE_PROVIDER_SITE_OTHER): Payer: Self-pay | Admitting: Family Medicine

## 2020-11-05 ENCOUNTER — Encounter (INDEPENDENT_AMBULATORY_CARE_PROVIDER_SITE_OTHER): Payer: Self-pay

## 2020-11-05 DIAGNOSIS — E785 Hyperlipidemia, unspecified: Secondary | ICD-10-CM

## 2020-11-05 NOTE — Telephone Encounter (Signed)
Sent Pt a my chart message.

## 2020-11-05 NOTE — Telephone Encounter (Signed)
Would you like for me to refill the medication?

## 2020-11-14 ENCOUNTER — Ambulatory Visit (INDEPENDENT_AMBULATORY_CARE_PROVIDER_SITE_OTHER): Payer: BC Managed Care – PPO | Admitting: Family Medicine

## 2020-11-14 ENCOUNTER — Other Ambulatory Visit (INDEPENDENT_AMBULATORY_CARE_PROVIDER_SITE_OTHER): Payer: Self-pay | Admitting: Family Medicine

## 2020-11-14 ENCOUNTER — Encounter (INDEPENDENT_AMBULATORY_CARE_PROVIDER_SITE_OTHER): Payer: Self-pay | Admitting: Family Medicine

## 2020-11-14 ENCOUNTER — Other Ambulatory Visit: Payer: Self-pay

## 2020-11-14 VITALS — BP 124/76 | HR 80 | Temp 98.1°F | Ht 69.0 in | Wt 371.0 lb

## 2020-11-14 DIAGNOSIS — Z6841 Body Mass Index (BMI) 40.0 and over, adult: Secondary | ICD-10-CM

## 2020-11-14 DIAGNOSIS — Z9189 Other specified personal risk factors, not elsewhere classified: Secondary | ICD-10-CM | POA: Diagnosis not present

## 2020-11-14 DIAGNOSIS — I10 Essential (primary) hypertension: Secondary | ICD-10-CM | POA: Diagnosis not present

## 2020-11-14 DIAGNOSIS — R7303 Prediabetes: Secondary | ICD-10-CM | POA: Diagnosis not present

## 2020-11-14 MED ORDER — SEMAGLUTIDE-WEIGHT MANAGEMENT 1 MG/0.5ML ~~LOC~~ SOAJ: 1.0000 mg | SUBCUTANEOUS | 0 refills | Status: DC

## 2020-11-14 MED ORDER — CHLORTHALIDONE 25 MG PO TABS: 12.5000 mg | ORAL_TABLET | Freq: Every day | ORAL | 0 refills | Status: DC

## 2020-11-14 NOTE — Progress Notes (Signed)
Chief Complaint:   OBESITY Sharon Robertson is here to discuss her progress with her obesity treatment plan along with follow-up of her obesity related diagnoses. Sharon Robertson is on keeping a food journal and adhering to recommended goals of 1800 calories and 120+ grams of protein daily and states she is following her eating plan approximately 100% of the time. Sharon Robertson states she is walking and doing Zumba for 30-60 minutes 1-3 times per week.  Today's visit was #: 15 Starting weight: 410 lbs Starting date: 02/09/2020 Today's weight: 371 lbs Today's date: 11/14/2020 Total lbs lost to date: 39 Total lbs lost since last in-office visit: 0  Interim History: Sharon Robertson does report constipation with [name], but she denies other issues with Wegovy. She has found she really isn't able to eat as much as before (could only tolerate 7-8 bites at Thanksgiving). She is enjoying some time off for the next few weeks. She has been at 1800 calories daily and she finds it is getting easier to hit her protein goal.  Subjective:   1. Essential hypertension Sharon Robertson's blood pressure is controlled today. She is on lisinopril and chlorthalidone.  2. Pre-diabetes Sharon Robertson's last A1c was 6.0 and insulin 13.6. She is on GLP-1.  3. At risk for diabetes mellitus Sharon Robertson is at higher than average risk for developing diabetes due to obesity.   Assessment/Plan:   1. Essential hypertension Sharon Robertson is working on healthy weight loss and exercise to improve blood pressure control. We will watch for signs of hypotension as she continues her lifestyle modifications. We will refill chlorthalidone for 1 month.  - chlorthalidone (HYGROTON) 25 MG tablet; Take 0.5 tablets (12.5 mg total) by mouth daily.  Dispense: 45 tablet; Refill: 0  2. Pre-diabetes Sharon Robertson will continue to work on weight loss, exercise, and decreasing simple carbohydrates to help decrease the risk of diabetes. We will repeat labs at her next appointment.  3. At risk for diabetes  mellitus Sharon Robertson was given approximately 15 minutes of diabetes education and counseling today. We discussed intensive lifestyle modifications today with an emphasis on weight loss as well as increasing exercise and decreasing simple carbohydrates in her diet. We also reviewed medication options with an emphasis on risk versus benefit of those discussed.   Repetitive spaced learning was employed today to elicit superior memory formation and behavioral change.  4. Class 3 severe obesity with serious comorbidity and body mass index (BMI) of 50.0 to 59.9 in adult, unspecified obesity type (HCC) Sharon Robertson is currently in the action stage of change. As such, her goal is to continue with weight loss efforts. She has agreed to keeping a food journal and adhering to recommended goals of 1800 calories and 120+ grams of protein daily.   We discussed various medication options to help Sharon Robertson with her weight loss efforts and we both agreed to continue [facility] and we will refill for 1 month.  - Semaglutide-Weight Management 1 MG/0.5ML SOAJ; Inject 1 mg into the skin once a week.  Dispense: 2 mL; Refill: 0  Exercise goals: As is. She is to make plans for adding in resistance training.  Behavioral modification strategies: increasing lean protein intake, meal planning and cooking strategies, keeping healthy foods in the home, holiday eating strategies  and keeping a strict food journal.  Sharon Robertson has agreed to follow-up with our clinic in 3 to 4 weeks. She was informed of the importance of frequent follow-up visits to maximize her success with intensive lifestyle modifications for her multiple health conditions.  Objective:   Blood pressure 124/76, pulse 80, temperature 98.1 F (36.7 C), temperature source Oral, height 5\' 9"  (1.753 m), weight (!) 371 lb (168.3 kg), last menstrual period 11/10/2020, SpO2 98 %. Body mass index is 54.79 kg/m.  General: Cooperative, alert, well developed, in no acute distress. HEENT:  Conjunctivae and lids unremarkable. Cardiovascular: Regular rhythm.  Lungs: Normal work of breathing. Neurologic: No focal deficits.   Lab Results  Component Value Date   CREATININE 0.59 05/23/2020   BUN 19 05/23/2020   NA 137 05/23/2020   K 4.5 05/23/2020   CL 101 05/23/2020   CO2 23 05/23/2020   Lab Results  Component Value Date   ALT 19 05/23/2020   AST 16 05/23/2020   ALKPHOS 90 05/23/2020   BILITOT 0.3 05/23/2020   Lab Results  Component Value Date   HGBA1C 6.0 (H) 05/23/2020   HGBA1C 5.9 (H) 01/04/2020   HGBA1C 5.9 (H) 07/05/2019   HGBA1C 5.6 03/20/2018   Lab Results  Component Value Date   INSULIN 13.6 05/23/2020   Lab Results  Component Value Date   TSH 2.410 05/23/2020   Lab Results  Component Value Date   CHOL 173 05/23/2020   HDL 44 05/23/2020   LDLCALC 112 (H) 05/23/2020   TRIG 92 05/23/2020   CHOLHDL 4.2 01/04/2020   Lab Results  Component Value Date   WBC 8.5 05/23/2020   HGB 12.2 05/23/2020   HCT 38.9 05/23/2020   MCV 83 05/23/2020   PLT 396 05/23/2020   Lab Results  Component Value Date   IRON 93 02/09/2020   TIBC 363 02/09/2020   FERRITIN 42 02/09/2020   Attestation Statements:   Reviewed by clinician on day of visit: allergies, medications, problem list, medical history, surgical history, family history, social history, and previous encounter notes.   I, 04/10/2020, am acting as transcriptionist for Burt Knack, MD.  I have reviewed the above documentation for accuracy and completeness, and I agree with the above. - Reuben Likes, MD

## 2020-11-15 ENCOUNTER — Encounter (INDEPENDENT_AMBULATORY_CARE_PROVIDER_SITE_OTHER): Payer: Self-pay

## 2020-12-06 ENCOUNTER — Encounter (INDEPENDENT_AMBULATORY_CARE_PROVIDER_SITE_OTHER): Payer: Self-pay | Admitting: Family Medicine

## 2020-12-06 ENCOUNTER — Ambulatory Visit (INDEPENDENT_AMBULATORY_CARE_PROVIDER_SITE_OTHER): Payer: BC Managed Care – PPO | Admitting: Family Medicine

## 2020-12-06 ENCOUNTER — Other Ambulatory Visit: Payer: Self-pay

## 2020-12-06 VITALS — BP 119/78 | HR 87 | Temp 97.5°F | Ht 69.0 in | Wt 365.0 lb

## 2020-12-06 DIAGNOSIS — Z9189 Other specified personal risk factors, not elsewhere classified: Secondary | ICD-10-CM

## 2020-12-06 DIAGNOSIS — E559 Vitamin D deficiency, unspecified: Secondary | ICD-10-CM

## 2020-12-06 DIAGNOSIS — I10 Essential (primary) hypertension: Secondary | ICD-10-CM | POA: Diagnosis not present

## 2020-12-06 DIAGNOSIS — Z6841 Body Mass Index (BMI) 40.0 and over, adult: Secondary | ICD-10-CM

## 2020-12-06 MED ORDER — VITAMIN D (ERGOCALCIFEROL) 1.25 MG (50000 UNIT) PO CAPS: 50000.0000 [IU] | ORAL_CAPSULE | ORAL | 0 refills | Status: DC

## 2020-12-06 MED ORDER — LISINOPRIL 5 MG PO TABS: 5.0000 mg | ORAL_TABLET | Freq: Every day | ORAL | 0 refills | Status: DC

## 2020-12-06 MED ORDER — SEMAGLUTIDE-WEIGHT MANAGEMENT 1 MG/0.5ML ~~LOC~~ SOAJ: 1.0000 mg | SUBCUTANEOUS | 0 refills | Status: DC

## 2020-12-06 NOTE — Progress Notes (Signed)
Chief Complaint:   OBESITY Sharon Robertson is here to discuss her progress with her obesity treatment plan along with follow-up of her obesity related diagnoses. Sharon Robertson is on keeping a food journal and adhering to recommended goals of 1800 calories and 120+ grams of protein and states she is following her eating plan approximately 100% of the time. Sharon Robertson states she is walking for 30 minutes 3 times a week and resistance training for 30 minutes 2 times a week.  Today's visit was #: 16 Starting weight: 410 lbs Starting date: 02/09/2020 Today's weight: 365 lbs Today's date: 12/06/2020 Total lbs lost to date: 45 Total lbs lost since last in-office visit: 6 lbs   Interim History: Sharon Robertson had a great last few weeks. She started dong some resistance training 2 times a week. She really stuck to plan over holiday season. 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  Subjective:   1. Vitamin D deficiency Sharon Robertson's Vitamin D level was 31.9 on 05/23/2020. She is currently taking prescription vitamin D 50,000 IU each week. She denies nausea, vomiting or muscle weakness, and notes fatigue.  2. Essential hypertension BP is well controlled today. No chest pain, chest pressure, and no headache. Sharon Robertson is on Lisinopril 10 mg daily,and chlorthalidone 12.5 mg daily.  BP Readings from Last 3 Encounters:  12/06/20 119/78  11/14/20 124/76  10/16/20 114/77    3. At risk for heart disease Sharon Robertson is at a higher than average risk for cardiovascular disease due to obesity.     Assessment/Plan:   1. Vitamin D deficiency Low Vitamin D level contributes to fatigue and are associated with obesity, breast, and colon cancer. She agrees to continue to take prescription Vitamin D @50 ,000 IU every week and will follow-up for routine testing of Vitamin D, at least 2-3 times per year to avoid over-replacement. Will refill Vit D 50,000 IU weekly #4 refill 0.  - Vitamin D, Ergocalciferol, (DRISDOL) 1.25 MG  (50000 UNIT) CAPS capsule; Take 1 capsule (50,000 Units total) by mouth every 7 (seven) days.  Dispense: 4 capsule; Refill: 0  2. Essential hypertension Sharon Robertson is working on healthy weight loss and exercise to improve blood pressure control. We will watch for signs of hypotension as she continues her lifestyle modifications. Will refill Lisinopril 5 mg po daily #30 refill 0.  - lisinopril (ZESTRIL) 5 MG tablet; Take 1 tablet (5 mg total) by mouth daily.  Dispense: 30 tablet; Refill: 0  3. At risk for heart disease Sharon Robertson was given approximately 15 minutes of coronary artery disease prevention counseling today. She is 50 y.o. female and has risk factors for heart disease including obesity. We discussed intensive lifestyle modifications today with an emphasis on specific weight loss instructions and strategies.   Repetitive spaced learning was employed today to elicit superior memory formation and behavioral change.  4. Class 3 severe obesity with serious comorbidity and body mass index (BMI) of 50.0 to 59.9 in adult, unspecified obesity type (HCC)  Sharon Robertson is currently in the action stage of change. As such, her goal is to continue with weight loss efforts. She has agreed to keeping a food journal and adhering to recommended goals of 1800 calories and 120+ grams of protein.    Semaglutide-Weight Management 1 MG/0.5ML SOAJ; Inject 1 mg into the skin once a week.  Dispense: 2 mL; Refill: 0  Exercise goals: Some exercise.    Behavioral modification strategies: increasing lean protein intake, meal planning and cooking strategies,  keeping healthy foods in the home and keeping a strict food journal.  Sharon Robertson has agreed to follow-up with our clinic in 2-3 weeks. She was informed of the importance of frequent follow-up visits to maximize her success with intensive lifestyle modifications for her multiple health conditions.    Objective:   Blood pressure 119/78, pulse 87, temperature (!) 97.5 F (36.4  C), temperature source Oral, height 5\' 9"  (1.753 m), weight (!) 365 lb (165.6 kg), last menstrual period 11/10/2020, SpO2 97 %. Body mass index is 53.9 kg/m.  General: Cooperative, alert, well developed, in no acute distress. HEENT: Conjunctivae and lids unremarkable. Cardiovascular: Regular rhythm.  Lungs: Normal work of breathing. Neurologic: No focal deficits.   Lab Results  Component Value Date   CREATININE 0.59 05/23/2020   BUN 19 05/23/2020   NA 137 05/23/2020   K 4.5 05/23/2020   CL 101 05/23/2020   CO2 23 05/23/2020   Lab Results  Component Value Date   ALT 19 05/23/2020   AST 16 05/23/2020   ALKPHOS 90 05/23/2020   BILITOT 0.3 05/23/2020   Lab Results  Component Value Date   HGBA1C 6.0 (H) 05/23/2020   HGBA1C 5.9 (H) 01/04/2020   HGBA1C 5.9 (H) 07/05/2019   HGBA1C 5.6 03/20/2018   Lab Results  Component Value Date   INSULIN 13.6 05/23/2020   Lab Results  Component Value Date   TSH 2.410 05/23/2020   Lab Results  Component Value Date   CHOL 173 05/23/2020   HDL 44 05/23/2020   LDLCALC 112 (H) 05/23/2020   TRIG 92 05/23/2020   CHOLHDL 4.2 01/04/2020   Lab Results  Component Value Date   WBC 8.5 05/23/2020   HGB 12.2 05/23/2020   HCT 38.9 05/23/2020   MCV 83 05/23/2020   PLT 396 05/23/2020   Lab Results  Component Value Date   IRON 93 02/09/2020   TIBC 363 02/09/2020   FERRITIN 42 02/09/2020      I, 04/10/2020, am acting as Delorse Limber for Energy manager, MD.  I have reviewed the above documentation for accuracy and completeness, and I agree with the above. - Reuben Likes, MD

## 2020-12-12 ENCOUNTER — Other Ambulatory Visit: Payer: Self-pay | Admitting: Neurology

## 2020-12-19 ENCOUNTER — Encounter (INDEPENDENT_AMBULATORY_CARE_PROVIDER_SITE_OTHER): Payer: Self-pay

## 2020-12-20 ENCOUNTER — Other Ambulatory Visit: Payer: Self-pay

## 2020-12-20 ENCOUNTER — Encounter (INDEPENDENT_AMBULATORY_CARE_PROVIDER_SITE_OTHER): Payer: Self-pay | Admitting: Family Medicine

## 2020-12-20 ENCOUNTER — Telehealth (INDEPENDENT_AMBULATORY_CARE_PROVIDER_SITE_OTHER): Payer: BC Managed Care – PPO | Admitting: Family Medicine

## 2020-12-20 DIAGNOSIS — Z6841 Body Mass Index (BMI) 40.0 and over, adult: Secondary | ICD-10-CM | POA: Diagnosis not present

## 2020-12-20 DIAGNOSIS — I10 Essential (primary) hypertension: Secondary | ICD-10-CM

## 2020-12-20 DIAGNOSIS — E559 Vitamin D deficiency, unspecified: Secondary | ICD-10-CM

## 2020-12-20 MED ORDER — VITAMIN D (ERGOCALCIFEROL) 1.25 MG (50000 UNIT) PO CAPS: 50000.0000 [IU] | ORAL_CAPSULE | ORAL | 0 refills | Status: DC

## 2020-12-20 MED ORDER — LISINOPRIL 5 MG PO TABS
5.0000 mg | ORAL_TABLET | Freq: Every day | ORAL | 0 refills | Status: DC
Start: 2020-12-20 — End: 2021-01-24

## 2020-12-25 NOTE — Progress Notes (Signed)
TeleHealth Visit:  Due to the COVID-19 pandemic, this visit was completed with telemedicine (audio/video) technology to reduce patient and provider exposure as well as to preserve personal protective equipment.   Sharon Robertson has verbally consented to this TeleHealth visit. The patient is located at home, the provider is located at the [facility] office. The participants in this visit include the listed provider and patient. The visit was conducted today via MyChart Video.   Chief Complaint: OBESITY Sharon Robertson is here to discuss her progress with her obesity treatment plan along with follow-up of her obesity related diagnoses. Sharon Robertson is on keeping a food journal and adhering to recommended goals of 1800 calories and 120 grams of protein and states she is following her eating plan approximately 100% of the time. Sharon Robertson states she is not exercising.  Today's visit was #: 17 Starting weight: 410 lbs Starting date: 02/09/2020  Interim History: Weight of 362.4 lbs this morning.Sharon Robertson has started doing some resistance training at home. She has been averaging 1800 calories and getting her protein goal by adding bars, and gatorade with protein. Sharon Robertson denies hunger or cravings. Sharon Robertson is doing well on wegovy. Using resistance training as a way to get some frustration out.  Subjective:   1. Essential hypertension  Review: taking medications as instructed, no medication side effects noted, no chest pain on exertion, no dyspnea on exertion, no swelling of ankles. Blood pressure today 122/77. Sharon Robertson denies chest pain, chest pressures, or headache.  BP Readings from Last 3 Encounters:  12/06/20 119/78  11/14/20 124/76  10/16/20 114/77   2. Vitamin D deficiency  Sharon Robertson is on prescription Vitamin D. She denies nausea, vomiting, or muscle weakness but notes fatigue.    Assessment/Plan:   1. Essential hypertension *Low Vitamin D level contributes to fatigue and are associated with obesity, breast,  and colon cancer. She agrees to continue to take prescription Vitamin D @50 ,000 IU every week and will follow-up for routine testing of Vitamin D, at least 2-3 times per year to avoid over-replacement. Will refill lisinopril 5 mg po daily #30 no refill.  - lisinopril (ZESTRIL) 5 MG tablet; Take 1 tablet (5 mg total) by mouth daily.  Dispense: 30 tablet; Refill: 0  2. Vitamin D deficiency Will refill Vitamin D 50,000 unit weekly #4 no refill.  - Vitamin D, Ergocalciferol, (DRISDOL) 1.25 MG (50000 UNIT) CAPS capsule; Take 1 capsule (50,000 Units total) by mouth every 7 (seven) days.  Dispense: 4 capsule; Refill: 0  3. Class 3 severe obesity with serious comorbidity and body mass index (BMI) of 50.0 to 59.9 in adult, unspecified obesity type (HCC)  Sharon Robertson is currently in the action stage of change. As such, her goal is to continue with weight loss efforts. She has agreed to keeping a food journal and adhering to recommended goals of 1800 calories and 120+ grams of protein daily.   Exercise goals: No exercise has been prescribed at this time.  Behavioral modification strategies: increasing lean protein intake, meal planning and cooking strategies, keeping healthy foods in the home and keeping a strict food journal.  Sharon Robertson has agreed to follow-up with our clinic in 2 weeks. She was informed of the importance of frequent follow-up visits to maximize her success with intensive lifestyle modifications for her multiple health conditions.    Objective:   VITALS: Per patient if applicable, see vitals. GENERAL: Alert and in no acute distress. CARDIOPULMONARY: No increased WOB. Speaking in clear sentences.  PSYCH: Pleasant and cooperative.  Speech normal rate and rhythm. Affect is appropriate. Insight and judgement are appropriate. Attention is focused, linear, and appropriate.  NEURO: Oriented as arrived to appointment on time with no prompting.   Lab Results  Component Value Date   CREATININE 0.59  05/23/2020   BUN 19 05/23/2020   NA 137 05/23/2020   K 4.5 05/23/2020   CL 101 05/23/2020   CO2 23 05/23/2020   Lab Results  Component Value Date   ALT 19 05/23/2020   AST 16 05/23/2020   ALKPHOS 90 05/23/2020   BILITOT 0.3 05/23/2020   Lab Results  Component Value Date   HGBA1C 6.0 (H) 05/23/2020   HGBA1C 5.9 (H) 01/04/2020   HGBA1C 5.9 (H) 07/05/2019   HGBA1C 5.6 03/20/2018   Lab Results  Component Value Date   INSULIN 13.6 05/23/2020   Lab Results  Component Value Date   TSH 2.410 05/23/2020   Lab Results  Component Value Date   CHOL 173 05/23/2020   HDL 44 05/23/2020   LDLCALC 112 (H) 05/23/2020   TRIG 92 05/23/2020   CHOLHDL 4.2 01/04/2020   Lab Results  Component Value Date   WBC 8.5 05/23/2020   HGB 12.2 05/23/2020   HCT 38.9 05/23/2020   MCV 83 05/23/2020   PLT 396 05/23/2020   Lab Results  Component Value Date   IRON 93 02/09/2020   TIBC 363 02/09/2020   FERRITIN 42 02/09/2020    Attestation Statements:   Reviewed by clinician on day of visit: allergies, medications, problem list, medical history, surgical history, family history, social history, and previous encounter notes.     I, Delorse Limber, am acting as transcriptionist for Reuben Likes, MD. I have reviewed the above documentation for accuracy and completeness, and I agree with the above. - Katherina Mires, MD

## 2021-01-07 ENCOUNTER — Ambulatory Visit: Payer: BC Managed Care – PPO | Admitting: Family Medicine

## 2021-01-07 ENCOUNTER — Encounter: Payer: Self-pay | Admitting: Family Medicine

## 2021-01-07 NOTE — Progress Notes (Deleted)
PATIENT: Sharon Robertson DOB: [birth year]  REASON FOR VISIT: follow up HISTORY FROM: patient  No chief complaint on file.    HISTORY OF PRESENT ILLNESS: Today 01/07/21 Sharon Robertson is a 50 y.o. female here today for follow up for OSA on CPAP.  She has not used CPAP since October. Headaches daytime sleepiness    HISTORY: (copied from Dr Oliva Bustard previous note)  Shequita Peplinski Schlosseris a 50 year old  Caucasian female patientand seen hereon 07/04/2020 in a RV ; She also underwent a paced baseline polysomnography on 06 March 2020 report at the time by Dr. Janeece Agee nurse practitioner.  She actually was seen by [facility] and that should be her referral source.  She has a brother who has been using CPAP and carries a diagnosis of OSA.  The patient's Epworth Sleepiness Scale was endorsed at 10 points, she reached an AHI of 12.9 REM AHI was 13.8 in supine sleep there was a significant accentuation to her age AHI of 55 events per hour she also had some periodic limb movements.  She was invited to return for a CPAP titration study which followed on 05 Apr 2020 here she responded excellent to positive airway pressure medium size Simplus full facemask was used and a CPAP machine prescribed for pressures encompassing the most effective pressures of 10, 12 and 13 cmH2O.  She is now here presenting the results of 90 days on auto CPAP she has used the machine 83% of the time she does still work some nights in those nights have to be examined from her compliance record her average user time is 4 hours and 16 minutes which still needs some help.  She has a AHI of 1.9 now a significant decrease in comparison to her baseline central apneas [place] 0.3, obstructive 0.2/h and unknown apnea score 0.1 the unknown seem to be related to air leakage.  The 95th percentile pressure is 12.8 and well within the current settings. She has been placed on a FFM , a Simplus in small size. She has not  felt ever comfortable with this FFM and was surprised to have been given it without any smaller ones being tried.    Sleep medicine consultation. Chiefconcernaccording to patient : I just have no restful restorative sleep.  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 She  has a past medical history of Asthma, Hyperlipemia, and Hypertension.. Super obesity, shortness of breath. She has received both Covid 19 vaccines.   Sleeprelevant medical history: sleep walking in childhood, still in stressful situation-  No other Parasomnia  , adenoid- but not Tonsillectomy, she has looked into weight ,osss surgery but was psychologically not chosen.  Familymedical /sleep history: brother on CPAP with OSA.  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. She lives in a household alone.  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  Tobacco use; never . ETOH use; never ,  Caffeine intake in form of Coffee( 1 cup I AM ) Soda( none ) Tea ( quit) , nor energy drinks. Regular exercise in form of walking/ swimming .   paradoxical response to melatonin =Insomnia.   Sleep habits are as follows: The patient's dinner time is between 6 PM. The patient goes to bed at 9.30 PM and is mostly asleep by 10 PM, continues to  sleep for 3 hours, wakes for one bathroom break, the first time at 1 AM.   The preferred sleep position is on her side, with the support of 2 pillows. No neck pain. Dreams are reportedly frequent/vivid- for the last 6 weeks. 5.30 AM is the usual rise time. The patient wakes up spontaneously- .  She reports not feeling refreshed or restored in AM, with symptoms such as dry mouth , morning headaches about twice a week- dull , right temple-not associated with  nausea. , and residual fatigue. Naps are taken seldomly . Power naps are  refreshing, but hard to do.     REVIEW OF SYSTEMS: Out of a complete 14 system review of symptoms, the patient complains only of the following symptoms, and all other reviewed systems are negative.    ALLERGIES: No Known Allergies  HOME MEDICATIONS: Outpatient Medications Prior to Visit  Medication Sig Dispense Refill  . atorvastatin (LIPITOR) 10 MG tablet TAKE 1 TABLET BY MOUTH EVERY DAY 90 tablet 0  . cetirizine (ZYRTEC) 10 MG tablet Take 10 mg by mouth daily.    . chlorthalidone (HYGROTON) 25 MG tablet Take 0.5 tablets (12.5 mg total) by mouth daily. 45 tablet 0  . lisinopril (ZESTRIL) 5 MG tablet Take 1 tablet (5 mg total) by mouth daily. 30 tablet 0  . rOPINIRole (REQUIP) 0.25 MG tablet TAKE 1 TABLET BY MOUTH AT BEDTIME 90 tablet 1  . Semaglutide-Weight Management 1 MG/0.5ML SOAJ Inject 1 mg into the skin once a week. 2 mL 0  . Vitamin D, Ergocalciferol, (DRISDOL) 1.25 MG (50000 UNIT) CAPS capsule Take 1 capsule (50,000 Units total) by mouth every 7 (seven) days. 4 capsule 0   No facility-administered medications prior to visit.    PAST MEDICAL HISTORY: Past Medical History:  Diagnosis Date  . Asthma   . Hyperlipemia   . Hypertension     PAST SURGICAL HISTORY: Past Surgical History:  Procedure Laterality Date  . ADENOIDECTOMY     childhood    FAMILY HISTORY: Family History  Problem Relation Age of Onset  . Diabetes Mother   . Heart disease Mother   . Hyperlipidemia Mother   . Hypertension Mother   . Obesity Mother   . Cancer Father        prostate  . Diabetes Father   . Hyperlipidemia Father   . Hypertension Father   . Obesity Father   . Heart disease Brother   . Heart disease Maternal Grandmother   . Diabetes Maternal Grandmother   . Heart disease Maternal Grandfather   . Heart disease Paternal Grandmother   . Heart disease Paternal Grandfather     SOCIAL HISTORY: Social History   Socioeconomic History  . Marital status: Single    Spouse name:  Not on file  . Number of children: Not on file  . Years of education: Not on file  . Highest education level: Not on file  Occupational History  . Occupation: Designer, jewellery  Tobacco Use  . Smoking status: Never Smoker  . Smokeless tobacco: Never Used  Vaping Use  . Vaping Use: Never used  Substance and Sexual Activity  . Alcohol use: Never  . Drug use: Never  . Sexual activity: Not on file  Other Topics Concern  . Not on file  Social History Narrative  . Not on file   Social Determinants of Health   Financial Resource Strain: Not on file  Food Insecurity: Not on file  Transportation Needs: Not on file  Physical Activity: Not on file  Stress: Not on file  Social Connections: Not on file  Intimate Partner Violence: Not on file     PHYSICAL EXAM  There were no vitals filed for this visit. There is no height or weight on file to calculate BMI.  Generalized: Well developed, in no acute distress  Cardiology: normal rate and rhythm, no murmur noted Respiratory: clear to auscultation bilaterally  Neurological examination  Mentation: Alert oriented to time, place, history taking. Follows all commands speech and language fluent Cranial nerve II-XII: Pupils were equal round reactive to light. Extraocular movements were full, visual field were full  Motor: The motor testing reveals 5 over 5 strength of all 4 extremities. Good symmetric motor tone is noted throughout.  Gait and station: Gait is normal.    DIAGNOSTIC DATA (LABS, IMAGING, TESTING) - I reviewed patient records, labs, notes, testing and imaging myself where available.  No flowsheet data found.   Lab Results  Component Value Date   WBC 8.5 05/23/2020   HGB 12.2 05/23/2020   HCT 38.9 05/23/2020   MCV 83 05/23/2020   PLT 396 05/23/2020      Component Value Date/Time   NA 137 05/23/2020 1418   K 4.5 05/23/2020 1418   CL 101 05/23/2020 1418   CO2 23 05/23/2020 1418   GLUCOSE 103 (H) 05/23/2020 1418    BUN 19 05/23/2020 1418   CREATININE 0.59 05/23/2020 1418   CALCIUM 9.2 05/23/2020 1418   PROT 6.8 05/23/2020 1418   ALBUMIN 3.7 (L) 05/23/2020 1418   AST 16 05/23/2020 1418   ALT 19 05/23/2020 1418   ALKPHOS 90 05/23/2020 1418   BILITOT 0.3 05/23/2020 1418   GFRNONAA 108 05/23/2020 1418   GFRAA 124 05/23/2020 1418   Lab Results  Component Value Date   CHOL 173 05/23/2020   HDL 44 05/23/2020   LDLCALC 112 (H) 05/23/2020   TRIG 92 05/23/2020   CHOLHDL 4.2 01/04/2020   Lab Results  Component Value Date   HGBA1C 6.0 (H) 05/23/2020   Lab Results  Component Value Date   VITAMINB12 290 02/09/2020   Lab Results  Component Value Date   TSH 2.410 05/23/2020     ASSESSMENT AND PLAN 50 y.o. year old female  has a past medical history of Asthma, Hyperlipemia, and Hypertension. here with   No diagnosis found.   Sharon Robertson is doing well on CPAP therapy. Compliance report reveals no usage since October, 2021. *** was encouraged to continue using CPAP nightly and for greater than 4 hours each night. We will update supply orders as indicated. Risks of untreated sleep apnea review and education materials provided. Healthy lifestyle habits encouraged. *** will follow up in ***, sooner if needed. *** verbalizes understanding and agreement with this plan.   No orders of the defined types were placed in this encounter.    No orders of the defined types were placed in this encounter.     I spent 15 minutes with the patient. 50% of this time was spent counseling and educating patient on plan of care and medications.    Shawnie Dapper, FNP-C 01/07/2021, 12:24 PM [facility] [address] [place], [place] [address] [phone]

## 2021-01-07 NOTE — Patient Instructions (Incomplete)
Please resume using your CPAP regularly. While your insurance requires that you use CPAP at least 4 hours each night on 70% of the nights, I recommend, that you not skip any nights and use it throughout the night if you can. Getting used to CPAP and staying with the treatment long term does take time and patience and discipline. Untreated obstructive sleep apnea when it is moderate to severe can have an adverse impact on cardiovascular health and raise her risk for heart disease, arrhythmias, hypertension, congestive heart failure, stroke and diabetes. Untreated obstructive sleep apnea causes sleep disruption, nonrestorative sleep, and sleep deprivation. This can have an impact on your day to day functioning and cause daytime sleepiness and impairment of cognitive function, memory loss, mood disturbance, and problems focussing. Using CPAP regularly can improve these symptoms.   Sleep Apnea Sleep apnea affects breathing during sleep. It causes breathing to stop for a short time or to become shallow. It can also increase the risk of:  Heart attack.  Stroke.  Being very overweight (obese).  Diabetes.  Heart failure.  Irregular heartbeat. The goal of treatment is to help you breathe normally again. What are the causes? There are three kinds of sleep apnea:  Obstructive sleep apnea. This is caused by a blocked or collapsed airway.  Central sleep apnea. This happens when the brain does not send the right signals to the muscles that control breathing.  Mixed sleep apnea. This is a combination of obstructive and central sleep apnea. The most common cause of this condition is a collapsed or blocked airway. This can happen if:  Your throat muscles are too relaxed.  Your tongue and tonsils are too large.  You are overweight.  Your airway is too small.   What increases the risk?  Being overweight.  Smoking.  Having a small airway.  Being older.  Being female.  Drinking  alcohol.  Taking medicines to calm yourself (sedatives or tranquilizers).  Having family members with the condition. What are the signs or symptoms?  Trouble staying asleep.  Being sleepy or tired during the day.  Getting angry a lot.  Loud snoring.  Headaches in the morning.  Not being able to focus your mind (concentrate).  Forgetting things.  Less interest in sex.  Mood swings.  Personality changes.  Feelings of sadness (depression).  Waking up a lot during the night to pee (urinate).  Dry mouth.  Sore throat. How is this diagnosed?  Your medical history.  A physical exam.  A test that is done when you are sleeping (sleep study). The test is most often done in a sleep lab but may also be done at home. How is this treated?  Sleeping on your side.  Using a medicine to get rid of mucus in your nose (decongestant).  Avoiding the use of alcohol, medicines to help you relax, or certain pain medicines (narcotics).  Losing weight, if needed.  Changing your diet.  Not smoking.  Using a machine to open your airway while you sleep, such as: ? An oral appliance. This is a mouthpiece that shifts your lower jaw forward. ? A CPAP device. This device blows air through a mask when you breathe out (exhale). ? An EPAP device. This has valves that you put in each nostril. ? A BPAP device. This device blows air through a mask when you breathe in (inhale) and breathe out.  Having surgery if other treatments do not work. It is important to get treatment for sleep  for sleep apnea. Without treatment, it can lead to:  High blood pressure.  Coronary artery disease.  In men, not being able to have an erection (impotence).  Reduced thinking ability.   Follow these instructions at home: Lifestyle  Make changes that your doctor recommends.  Eat a healthy diet.  Lose weight if needed.  Avoid alcohol, medicines to help you relax, and some pain medicines.  Do not use any  products that contain nicotine or tobacco, such as cigarettes, e-cigarettes, and chewing tobacco. If you need help quitting, ask your doctor. General instructions  Take over-the-counter and prescription medicines only as told by your doctor.  If you were given a machine to use while you sleep, use it only as told by your doctor.  If you are having surgery, make sure to tell your doctor you have sleep apnea. You may need to bring your device with you.  Keep all follow-up visits as told by your doctor. This is important. Contact a doctor if:  The machine that you were given to use during sleep bothers you or does not seem to be working.  You do not get better.  You get worse. Get help right away if:  Your chest hurts.  You have trouble breathing in enough air.  You have an uncomfortable feeling in your back, arms, or stomach.  You have trouble talking.  One side of your body feels weak.  A part of your face is hanging down. These symptoms may be an emergency. Do not wait to see if the symptoms will go away. Get medical help right away. Call your local emergency services (911 in the [place] Do not drive yourself to the hospital. Summary  This condition affects breathing during sleep.  The most common cause is a collapsed or blocked airway.  The goal of treatment is to help you breathe normally while you sleep. This information is not intended to replace advice given to you by your health care provider. Make sure you discuss any questions you have with your health care provider. Document Revised: 09/03/2018 Document Reviewed: 07/13/2018 Elsevier Patient Education  2021 [employer]  

## 2021-01-08 ENCOUNTER — Encounter (INDEPENDENT_AMBULATORY_CARE_PROVIDER_SITE_OTHER): Payer: Self-pay | Admitting: Family Medicine

## 2021-01-08 ENCOUNTER — Ambulatory Visit (INDEPENDENT_AMBULATORY_CARE_PROVIDER_SITE_OTHER): Payer: BC Managed Care – PPO | Admitting: Family Medicine

## 2021-01-08 ENCOUNTER — Other Ambulatory Visit: Payer: Self-pay

## 2021-01-08 VITALS — BP 117/82 | HR 112 | Temp 97.8°F | Ht 69.0 in | Wt 360.0 lb

## 2021-01-08 DIAGNOSIS — Z9189 Other specified personal risk factors, not elsewhere classified: Secondary | ICD-10-CM | POA: Diagnosis not present

## 2021-01-08 DIAGNOSIS — R7303 Prediabetes: Secondary | ICD-10-CM | POA: Diagnosis not present

## 2021-01-08 DIAGNOSIS — Z6841 Body Mass Index (BMI) 40.0 and over, adult: Secondary | ICD-10-CM | POA: Diagnosis not present

## 2021-01-08 DIAGNOSIS — E7849 Other hyperlipidemia: Secondary | ICD-10-CM

## 2021-01-08 MED ORDER — WEGOVY 1.7 MG/0.75ML ~~LOC~~ SOAJ: 1.7000 mg | SUBCUTANEOUS | 0 refills | Status: DC

## 2021-01-10 ENCOUNTER — Encounter (INDEPENDENT_AMBULATORY_CARE_PROVIDER_SITE_OTHER): Payer: Self-pay

## 2021-01-10 NOTE — Progress Notes (Signed)
Chief Complaint:   OBESITY Sharon Robertson is here to discuss her progress with her obesity treatment plan along with follow-up of her obesity related diagnoses. Sharon Robertson is on keeping a food journal and adhering to recommended goals of 1800 calories and 120 g protein and states she is following her eating plan approximately 100% of the time. Sharon Robertson states she is walking and resistance training 30-60 minutes 3-4 times per week.  Today's visit was #: 18 Starting weight: 410 lbs Starting date: 02/09/2020 Today's weight: 360 lbs Today's date: 01/08/2021 Total lbs lost to date: 50 lbs Total lbs lost since last in-office visit: 5 lbs  Interim History: Sharon Robertson is doing well on journaling. She denies hunger or cravings. She is getting 1800 calories in daily and getting protein goal in daily. 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  Subjective:   1. Pre-diabetes Last A1c 6.0 and insulin level 13.6. Pt is on GLP-1 with improved carb cravings.  Lab Results  Component Value Date   HGBA1C 6.0 (H) 05/23/2020   Lab Results  Component Value Date   INSULIN 13.6 05/23/2020    2. Other hyperlipidemia Pt is on Lipitor. She denies myalgias or transaminitis. Her last LDL 112,HDL 44, and Triglycerides 92.  Lab Results  Component Value Date   ALT 19 05/23/2020   AST 16 05/23/2020   ALKPHOS 90 05/23/2020   BILITOT 0.3 05/23/2020   Lab Results  Component Value Date   CHOL 173 05/23/2020   HDL 44 05/23/2020   LDLCALC 112 (H) 05/23/2020   TRIG 92 05/23/2020   CHOLHDL 4.2 01/04/2020    3. At risk for heart disease Sharon Robertson is at a higher than average risk for cardiovascular disease due to obesity.   Assessment/Plan:   1. Pre-diabetes Sharon Robertson will continue to work on weight loss, exercise, and decreasing simple carbohydrates to help decrease the risk of diabetes. Check labs at next appointment.  2. Other hyperlipidemia Cardiovascular risk and specific lipid/LDL  goals reviewed.  We discussed several lifestyle modifications today and Sharon Robertson will continue to work on diet, exercise and weight loss efforts. Orders and follow up as documented in patient record. Check labs at next appointment.  Counseling Intensive lifestyle modifications are the first line treatment for this issue. . Dietary changes: Increase soluble fiber. Decrease simple carbohydrates. . Exercise changes: Moderate to vigorous-intensity aerobic activity 150 minutes per week if tolerated. . Lipid-lowering medications: see documented in medical record.   3. At risk for heart disease Sharon Robertson was given approximately 15 minutes of coronary artery disease prevention counseling today. She is 50 y.o. female and has risk factors for heart disease including obesity. We discussed intensive lifestyle modifications today with an emphasis on specific weight loss instructions and strategies.   Repetitive spaced learning was employed today to elicit superior memory formation and behavioral change.  4. Class 3 severe obesity with serious comorbidity and body mass index (BMI) of 50.0 to 59.9 in adult, unspecified obesity type (HCC) Sharon Robertson is currently in the action stage of change. As such, her goal is to continue with weight loss efforts. She has agreed to keeping a food journal and adhering to recommended goals of 1800 calories and 120+ g protein.   We discussed various medication options to help Sharon Robertson with her weight loss efforts and we both agreed to continue [facility]. - Semaglutide-Weight Management (WEGOVY) 1.7 MG/0.75ML SOAJ; Inject 1.7 mg into the skin once a week.  Dispense:  3 mL; Refill: 0  Exercise goals: As is  Behavioral modification strategies: increasing lean protein intake, meal planning and cooking strategies, keeping healthy foods in the home, planning for success and keeping a strict food journal.  Sharon Robertson has agreed to follow-up with our clinic in 2-3 weeks. She was informed of the importance  of frequent follow-up visits to maximize her success with intensive lifestyle modifications for her multiple health conditions.   Objective:   Blood pressure 117/82, pulse (!) 112, temperature 97.8 F (36.6 C), temperature source Oral, height 5\' 9"  (1.753 m), weight (!) 360 lb (163.3 kg), SpO2 97 %. Body mass index is 53.16 kg/m.  General: Cooperative, alert, well developed, in no acute distress. HEENT: Conjunctivae and lids unremarkable. Cardiovascular: Regular rhythm.  Lungs: Normal work of breathing. Neurologic: No focal deficits.   Lab Results  Component Value Date   CREATININE 0.59 05/23/2020   BUN 19 05/23/2020   NA 137 05/23/2020   K 4.5 05/23/2020   CL 101 05/23/2020   CO2 23 05/23/2020   Lab Results  Component Value Date   ALT 19 05/23/2020   AST 16 05/23/2020   ALKPHOS 90 05/23/2020   BILITOT 0.3 05/23/2020   Lab Results  Component Value Date   HGBA1C 6.0 (H) 05/23/2020   HGBA1C 5.9 (H) 01/04/2020   HGBA1C 5.9 (H) 07/05/2019   HGBA1C 5.6 03/20/2018   Lab Results  Component Value Date   INSULIN 13.6 05/23/2020   Lab Results  Component Value Date   TSH 2.410 05/23/2020   Lab Results  Component Value Date   CHOL 173 05/23/2020   HDL 44 05/23/2020   LDLCALC 112 (H) 05/23/2020   TRIG 92 05/23/2020   CHOLHDL 4.2 01/04/2020   Lab Results  Component Value Date   WBC 8.5 05/23/2020   HGB 12.2 05/23/2020   HCT 38.9 05/23/2020   MCV 83 05/23/2020   PLT 396 05/23/2020   Lab Results  Component Value Date   IRON 93 02/09/2020   TIBC 363 02/09/2020   FERRITIN 42 02/09/2020    Attestation Statements:   Reviewed by clinician on day of visit: allergies, medications, problem list, medical history, surgical history, family history, social history, and previous encounter notes.  04/10/2020, am acting as transcriptionist for Edmund Hilda, MD.   I have reviewed the above documentation for accuracy and completeness, and I agree with the above.  - Reuben Likes, MD

## 2021-01-24 ENCOUNTER — Ambulatory Visit (INDEPENDENT_AMBULATORY_CARE_PROVIDER_SITE_OTHER): Payer: BC Managed Care – PPO | Admitting: Physician Assistant

## 2021-01-24 ENCOUNTER — Encounter (INDEPENDENT_AMBULATORY_CARE_PROVIDER_SITE_OTHER): Payer: Self-pay | Admitting: Physician Assistant

## 2021-01-24 ENCOUNTER — Ambulatory Visit (INDEPENDENT_AMBULATORY_CARE_PROVIDER_SITE_OTHER): Payer: BC Managed Care – PPO | Admitting: Family Medicine

## 2021-01-24 ENCOUNTER — Other Ambulatory Visit: Payer: Self-pay

## 2021-01-24 VITALS — BP 127/80 | HR 88 | Temp 98.4°F | Ht 69.0 in | Wt 358.0 lb

## 2021-01-24 DIAGNOSIS — E559 Vitamin D deficiency, unspecified: Secondary | ICD-10-CM

## 2021-01-24 DIAGNOSIS — E7849 Other hyperlipidemia: Secondary | ICD-10-CM

## 2021-01-24 DIAGNOSIS — I10 Essential (primary) hypertension: Secondary | ICD-10-CM

## 2021-01-24 DIAGNOSIS — Z9189 Other specified personal risk factors, not elsewhere classified: Secondary | ICD-10-CM | POA: Diagnosis not present

## 2021-01-24 DIAGNOSIS — R7303 Prediabetes: Secondary | ICD-10-CM | POA: Diagnosis not present

## 2021-01-24 DIAGNOSIS — Z6841 Body Mass Index (BMI) 40.0 and over, adult: Secondary | ICD-10-CM

## 2021-01-24 MED ORDER — VITAMIN D (ERGOCALCIFEROL) 1.25 MG (50000 UNIT) PO CAPS
50000.0000 [IU] | ORAL_CAPSULE | ORAL | 0 refills | Status: DC
Start: 2021-01-24 — End: 2021-02-27

## 2021-01-24 MED ORDER — LISINOPRIL 5 MG PO TABS
5.0000 mg | ORAL_TABLET | Freq: Every day | ORAL | 0 refills | Status: DC
Start: 2021-01-24 — End: 2021-02-27

## 2021-01-26 LAB — COMPREHENSIVE METABOLIC PANEL
ALT: 22 IU/L (ref 0–32)
AST: 19 IU/L (ref 0–40)
Albumin/Globulin Ratio: 1.3 (ref 1.2–2.2)
Albumin: 4.1 g/dL (ref 3.8–4.8)
Alkaline Phosphatase: 97 IU/L (ref 44–121)
BUN/Creatinine Ratio: 28 — ABNORMAL HIGH (ref 9–23)
BUN: 19 mg/dL (ref 6–24)
Bilirubin Total: 0.4 mg/dL (ref 0.0–1.2)
CO2: 24 mmol/L (ref 20–29)
Calcium: 9.4 mg/dL (ref 8.7–10.2)
Chloride: 100 mmol/L (ref 96–106)
Creatinine, Ser: 0.67 mg/dL (ref 0.57–1.00)
GFR calc Af Amer: 119 mL/min/{1.73_m2} (ref >59)
GFR calc non Af Amer: 104 mL/min/{1.73_m2} (ref >59)
Globulin, Total: 3.2 g/dL (ref 1.5–4.5)
Glucose: 84 mg/dL (ref 65–99)
Potassium: 4.3 mmol/L (ref 3.5–5.2)
Sodium: 139 mmol/L (ref 134–144)
Total Protein: 7.3 g/dL (ref 6.0–8.5)

## 2021-01-26 LAB — LIPID PANEL
Chol/HDL Ratio: 3.9 ratio (ref 0.0–4.4)
Cholesterol, Total: 189 mg/dL (ref 100–199)
HDL: 49 mg/dL (ref >39)
LDL Chol Calc (NIH): 125 mg/dL — ABNORMAL HIGH (ref 0–99)
Triglycerides: 80 mg/dL (ref 0–149)
VLDL Cholesterol Cal: 15 mg/dL (ref 5–40)

## 2021-01-26 LAB — VITAMIN D 25 HYDROXY (VIT D DEFICIENCY, FRACTURES): Vit D, 25-Hydroxy: 41.8 ng/mL (ref 30.0–100.0)

## 2021-01-26 LAB — INSULIN, RANDOM: INSULIN: 8.5 u[IU]/mL (ref 2.6–24.9)

## 2021-01-26 LAB — HEMOGLOBIN A1C
Est. average glucose Bld gHb Est-mCnc: 117 mg/dL
Hgb A1c MFr Bld: 5.7 % — ABNORMAL HIGH (ref 4.8–5.6)

## 2021-01-28 NOTE — Progress Notes (Signed)
Chief Complaint:   OBESITY Sharon Robertson is here to discuss her progress with her obesity treatment plan along with follow-up of her obesity related diagnoses. Sharon Robertson is on keeping a food journal and adhering to recommended goals of 1800 calories and 120+ grams of protein daily and states she is following her eating plan approximately 100% of the time. Sharon Robertson states she is walking and doing resistance for 60 minutes 4 times per week.  Today's visit was #: 19 Starting weight: 410 lbs Starting date: 02/09/2020 Today's weight: 358 lbs Today's date: 01/24/2021 Total lbs lost to date: 52 Total lbs lost since last in-office visit: 2  Interim History: Sharon Robertson is tolerating Wegovy 1.7 mg well and it has cut out her cravings. She has started resistance training and she is enjoying it. She is getting 1800 calories and meeting her protein goal daily. She is sleeping a lot better and using her CPAP as prescribed.  Subjective:   1. Essential hypertension [name]'s blood pressure is controlled, and she denies chest pain or headache.  2. Other hyperlipidemia [name] is on Lipitor, and she denies myalgias. She is exercising regularly.  3. Pre-diabetes Sharon Robertson denies polyphagia or cravings. Last A1c was 6.0. She is due for labs. She is exercising regularly.  4. Vitamin D deficiency Sharon Robertson is due for labs. She is on Vit D weekly.  5. At risk for diabetes mellitus Sharon Robertson is at higher than average risk for developing diabetes due to obesity.   Assessment/Plan:   1. Essential hypertension Shakenya is working on healthy weight loss and exercise to improve blood pressure control. We will watch for signs of hypotension as she continues her lifestyle modifications. We will refill lisinopril for 1 months.  - lisinopril (ZESTRIL) 5 MG tablet; Take 1 tablet (5 mg total) by mouth daily.  Dispense: 30 tablet; Refill: 0  2. Other hyperlipidemia Cardiovascular risk and specific lipid/LDL goals reviewed. We discussed several  lifestyle modifications today. [name] will continue her medications, and will continue to work on diet, exercise and weight loss efforts. We will check labs today. Orders and follow up as documented in patient record.   Counseling Intensive lifestyle modifications are the first line treatment for this issue. . Dietary changes: Increase soluble fiber. Decrease simple carbohydrates. . Exercise changes: Moderate to vigorous-intensity aerobic activity 150 minutes per week if tolerated. . Lipid-lowering medications: see documented in medical record.  - Lipid panel  3. Pre-diabetes Sharon Robertson will continue her meal plan, and will continue to work on weight loss, exercise, and decreasing simple carbohydrates to help decrease the risk of diabetes. We will check labs today.  - Comprehensive metabolic panel - Hemoglobin A1c - Insulin, random  4. Vitamin D deficiency Low Vitamin D level contributes to fatigue and are associated with obesity, breast, and colon cancer. We will check labs today, and we will refill prescription Vitamin D for 1 month. [name] will follow-up for routine testing of Vitamin D, at least 2-3 times per year to avoid over-replacement.  - VITAMIN D 25 Hydroxy (Vit-D Deficiency, Fractures) - Vitamin D, Ergocalciferol, (DRISDOL) 1.25 MG (50000 UNIT) CAPS capsule; Take 1 capsule (50,000 Units total) by mouth every 7 (seven) days.  Dispense: 4 capsule; Refill: 0  5. At risk for diabetes mellitus [name] was given approximately 15 minutes of diabetes education and counseling today. We discussed intensive lifestyle modifications today with an emphasis on weight loss as well as increasing exercise and decreasing simple carbohydrates in her diet. We also reviewed medication options  with an emphasis on risk versus benefit of those discussed.   Repetitive spaced learning was employed today to elicit superior memory formation and behavioral change.  6. Class 3 severe obesity with serious comorbidity  and body mass index (BMI) of 50.0 to 59.9 in adult, unspecified obesity type (HCC) Sharon Robertson is currently in the action stage of change. As such, her goal is to continue with weight loss efforts. She has agreed to keeping a food journal and adhering to recommended goals of 1800 calories and 120 grams of protein.   Exercise goals: As is.  Behavioral modification strategies: meal planning and cooking strategies and keeping healthy foods in the home.  Sharon Robertson has agreed to follow-up with our clinic in 2 weeks. She was informed of the importance of frequent follow-up visits to maximize her success with intensive lifestyle modifications for her multiple health conditions.   Sharon Robertson was informed we would discuss her lab results at her next visit unless there is a critical issue that needs to be addressed sooner. Sharon Robertson agreed to keep her next visit at the agreed upon time to discuss these results.  Objective:   Blood pressure 127/80, pulse 88, temperature 98.4 F (36.9 C), height 5\' 9"  (1.753 m), weight (!) 358 lb (162.4 kg), SpO2 99 %. Body mass index is 52.87 kg/m.  General: Cooperative, alert, well developed, in no acute distress. HEENT: Conjunctivae and lids unremarkable. Cardiovascular: Regular rhythm.  Lungs: Normal work of breathing. Neurologic: No focal deficits.   Lab Results  Component Value Date   CREATININE 0.67 01/24/2021   BUN 19 01/24/2021   NA 139 01/24/2021   K 4.3 01/24/2021   CL 100 01/24/2021   CO2 24 01/24/2021   Lab Results  Component Value Date   ALT 22 01/24/2021   AST 19 01/24/2021   ALKPHOS 97 01/24/2021   BILITOT 0.4 01/24/2021   Lab Results  Component Value Date   HGBA1C 5.7 (H) 01/24/2021   HGBA1C 6.0 (H) 05/23/2020   HGBA1C 5.9 (H) 01/04/2020   HGBA1C 5.9 (H) 07/05/2019   HGBA1C 5.6 03/20/2018   Lab Results  Component Value Date   INSULIN 8.5 01/24/2021   INSULIN 13.6 05/23/2020   Lab Results  Component Value Date   TSH 2.410 05/23/2020   Lab  Results  Component Value Date   CHOL 189 01/24/2021   HDL 49 01/24/2021   LDLCALC 125 (H) 01/24/2021   TRIG 80 01/24/2021   CHOLHDL 3.9 01/24/2021   Lab Results  Component Value Date   WBC 8.5 05/23/2020   HGB 12.2 05/23/2020   HCT 38.9 05/23/2020   MCV 83 05/23/2020   PLT 396 05/23/2020   Lab Results  Component Value Date   IRON 93 02/09/2020   TIBC 363 02/09/2020   FERRITIN 42 02/09/2020   Attestation Statements:   Reviewed by clinician on day of visit: allergies, medications, problem list, medical history, surgical history, family history, social history, and previous encounter notes.   04/10/2020, am acting as transcriptionist for Trude Mcburney, PA-C.  I have reviewed the above documentation for accuracy and completeness, and I agree with the above. [employer], PA-C

## 2021-02-02 ENCOUNTER — Other Ambulatory Visit (INDEPENDENT_AMBULATORY_CARE_PROVIDER_SITE_OTHER): Payer: Self-pay | Admitting: Family Medicine

## 2021-02-02 DIAGNOSIS — E785 Hyperlipidemia, unspecified: Secondary | ICD-10-CM

## 2021-02-04 NOTE — Telephone Encounter (Signed)
Have her talk with Dr. Manson Passey during her appt in 3 days regarding her atorvastatin bc she just had labs. Thanks

## 2021-02-04 NOTE — Telephone Encounter (Signed)
Please review

## 2021-02-04 NOTE — Telephone Encounter (Signed)
Last OV with Tracey 

## 2021-02-04 NOTE — Telephone Encounter (Signed)
FYI

## 2021-02-06 ENCOUNTER — Ambulatory Visit (INDEPENDENT_AMBULATORY_CARE_PROVIDER_SITE_OTHER): Payer: BC Managed Care – PPO | Admitting: Physician Assistant

## 2021-02-07 ENCOUNTER — Ambulatory Visit (INDEPENDENT_AMBULATORY_CARE_PROVIDER_SITE_OTHER): Payer: BC Managed Care – PPO | Admitting: Bariatrics

## 2021-02-07 ENCOUNTER — Encounter (INDEPENDENT_AMBULATORY_CARE_PROVIDER_SITE_OTHER): Payer: Self-pay | Admitting: Bariatrics

## 2021-02-07 ENCOUNTER — Other Ambulatory Visit: Payer: Self-pay

## 2021-02-07 VITALS — BP 132/86 | HR 81 | Temp 97.5°F | Ht 69.0 in | Wt 358.0 lb

## 2021-02-07 DIAGNOSIS — E559 Vitamin D deficiency, unspecified: Secondary | ICD-10-CM

## 2021-02-07 DIAGNOSIS — E785 Hyperlipidemia, unspecified: Secondary | ICD-10-CM

## 2021-02-07 DIAGNOSIS — Z6841 Body Mass Index (BMI) 40.0 and over, adult: Secondary | ICD-10-CM | POA: Diagnosis not present

## 2021-02-07 DIAGNOSIS — Z9189 Other specified personal risk factors, not elsewhere classified: Secondary | ICD-10-CM | POA: Diagnosis not present

## 2021-02-07 MED ORDER — WEGOVY 1.7 MG/0.75ML ~~LOC~~ SOAJ: 1.7000 mg | SUBCUTANEOUS | 0 refills | Status: DC

## 2021-02-12 NOTE — Telephone Encounter (Signed)
Please review

## 2021-02-12 NOTE — Progress Notes (Signed)
Chief Complaint:   OBESITY Sharon Robertson is here to discuss her progress with her obesity treatment plan along with follow-up of her obesity related diagnoses. Sharon Robertson is on keeping a food journal and adhering to recommended goals of 1800 calories and 120 grams of protein daily and states she is following her eating plan approximately 100% of the time. Sharon Robertson states she is walking for 60 minutes 4 times per week, and using resistance bands for 30 minutes 3 times per week.  Today's visit was #: 20 Starting weight: 410 lbs Starting date: 02/09/2020 Today's weight: 358 lbs Today's date: 02/07/2021 Total lbs lost to date: 52 Total lbs lost since last in-office visit: 0  Interim History: Sharon Robertson's weight remains the same. She is taking Wegovy and she is doing well. She states that the [name] is working well.  Subjective:   1. Dyslipidemia Sharon Robertson is taking Lipitor 10 mg q daily.  2. Vitamin D deficiency Sharon Robertson is taking Vit D prescription once weekly.  3. At risk for osteoporosis Sharon Robertson is at higher risk of osteopenia and osteoporosis due to Vitamin D deficiency.   Assessment/Plan:   1. Dyslipidemia Cardiovascular risk and specific lipid/LDL goals reviewed. We discussed several lifestyle modifications today. Sharon Robertson will continue her medication, and will continue to work on diet, exercise and weight loss efforts. No trans fats and decrease saturated fats. Orders and follow up as documented in patient record.   Counseling Intensive lifestyle modifications are the first line treatment for this issue. . Dietary changes: Increase soluble fiber. Decrease simple carbohydrates. . Exercise changes: Moderate to vigorous-intensity aerobic activity 150 minutes per week if tolerated. . Lipid-lowering medications: see documented in medical record.  2. Vitamin D deficiency Low Vitamin D level contributes to fatigue and are associated with obesity, breast, and colon cancer. Sharon Robertson agreed to continue taking  prescription Vitamin D 50,000 IU every week and will follow-up for routine testing of Vitamin D, at least 2-3 times per year to avoid over-replacement.  3. At risk for osteoporosis Sharon Robertson was given approximately 15 minutes of osteoporosis prevention counseling today. Sharon Robertson is at risk for osteopenia and osteoporosis due to her Vitamin D deficiency. She was encouraged to take her Vitamin D and follow her higher calcium diet and increase strengthening exercise to help strengthen her bones and decrease her risk of osteopenia and osteoporosis.  Repetitive spaced learning was employed today to elicit superior memory formation and behavioral change.  4. Class 3 severe obesity with serious comorbidity and body mass index (BMI) of 50.0 to 59.9 in adult, unspecified obesity type (HCC) Sharon Robertson is currently in the action stage of change. As such, her goal is to continue with weight loss efforts. She has agreed to keeping a food journal and adhering to recommended goals of 1800 calories and 120 grams of protein daily.   We discussed various medication options to help Sharon Robertson with her weight loss efforts and we both agreed to continue [place], and we will refill for 1 month.  - Semaglutide-Weight Management (WEGOVY) 1.7 MG/0.75ML SOAJ; Inject 1.7 mg into the skin once a week.  Dispense: 3 mL; Refill: 0  Recipes II was given today.  Exercise goals: As is.  Behavioral modification strategies: increasing lean protein intake, decreasing simple carbohydrates, increasing vegetables, increasing water intake, decreasing eating out, no skipping meals, meal planning and cooking strategies, keeping healthy foods in the home and planning for success.  Sharon Robertson has agreed to follow-up with our clinic in 2 weeks with Dr. Lawson Radar or  Sharon Cliche, PA-C. She was informed of the importance of frequent follow-up visits to maximize her success with intensive lifestyle modifications for her multiple health conditions.   Objective:    Blood pressure 132/86, pulse 81, temperature (!) 97.5 F (36.4 C), height 5\' 9"  (1.753 m), weight (!) 358 lb (162.4 kg), last menstrual period 02/05/2021, SpO2 100 %. Body mass index is 52.87 kg/m.  General: Cooperative, alert, well developed, in no acute distress. HEENT: Conjunctivae and lids unremarkable. Cardiovascular: Regular rhythm.  Lungs: Normal work of breathing. Neurologic: No focal deficits.   Lab Results  Component Value Date   CREATININE 0.67 01/24/2021   BUN 19 01/24/2021   NA 139 01/24/2021   K 4.3 01/24/2021   CL 100 01/24/2021   CO2 24 01/24/2021   Lab Results  Component Value Date   ALT 22 01/24/2021   AST 19 01/24/2021   ALKPHOS 97 01/24/2021   BILITOT 0.4 01/24/2021   Lab Results  Component Value Date   HGBA1C 5.7 (H) 01/24/2021   HGBA1C 6.0 (H) 05/23/2020   HGBA1C 5.9 (H) 01/04/2020   HGBA1C 5.9 (H) 07/05/2019   HGBA1C 5.6 03/20/2018   Lab Results  Component Value Date   INSULIN 8.5 01/24/2021   INSULIN 13.6 05/23/2020   Lab Results  Component Value Date   TSH 2.410 05/23/2020   Lab Results  Component Value Date   CHOL 189 01/24/2021   HDL 49 01/24/2021   LDLCALC 125 (H) 01/24/2021   TRIG 80 01/24/2021   CHOLHDL 3.9 01/24/2021   Lab Results  Component Value Date   WBC 8.5 05/23/2020   HGB 12.2 05/23/2020   HCT 38.9 05/23/2020   MCV 83 05/23/2020   PLT 396 05/23/2020   Lab Results  Component Value Date   IRON 93 02/09/2020   TIBC 363 02/09/2020   FERRITIN 42 02/09/2020   Attestation Statements:   Reviewed by clinician on day of visit: allergies, medications, problem list, medical history, surgical history, family history, social history, and previous encounter notes.   04/10/2020, am acting as Trude Mcburney for [employer], DO.  I have reviewed the above documentation for accuracy and completeness, and I agree with the above. [employer], DO

## 2021-02-13 ENCOUNTER — Encounter (INDEPENDENT_AMBULATORY_CARE_PROVIDER_SITE_OTHER): Payer: Self-pay | Admitting: Bariatrics

## 2021-02-13 ENCOUNTER — Encounter (INDEPENDENT_AMBULATORY_CARE_PROVIDER_SITE_OTHER): Payer: Self-pay

## 2021-02-21 ENCOUNTER — Ambulatory Visit (INDEPENDENT_AMBULATORY_CARE_PROVIDER_SITE_OTHER): Payer: BC Managed Care – PPO | Admitting: Family Medicine

## 2021-02-27 ENCOUNTER — Other Ambulatory Visit: Payer: Self-pay

## 2021-02-27 ENCOUNTER — Encounter (INDEPENDENT_AMBULATORY_CARE_PROVIDER_SITE_OTHER): Payer: Self-pay | Admitting: Family Medicine

## 2021-02-27 ENCOUNTER — Ambulatory Visit (INDEPENDENT_AMBULATORY_CARE_PROVIDER_SITE_OTHER): Payer: BC Managed Care – PPO | Admitting: Family Medicine

## 2021-02-27 VITALS — BP 147/82 | HR 90 | Temp 97.0°F | Ht 69.0 in | Wt 358.0 lb

## 2021-02-27 DIAGNOSIS — Z6841 Body Mass Index (BMI) 40.0 and over, adult: Secondary | ICD-10-CM

## 2021-02-27 DIAGNOSIS — E559 Vitamin D deficiency, unspecified: Secondary | ICD-10-CM | POA: Diagnosis not present

## 2021-02-27 DIAGNOSIS — Z9189 Other specified personal risk factors, not elsewhere classified: Secondary | ICD-10-CM

## 2021-02-27 DIAGNOSIS — I10 Essential (primary) hypertension: Secondary | ICD-10-CM | POA: Diagnosis not present

## 2021-02-27 MED ORDER — LISINOPRIL 5 MG PO TABS: 5.0000 mg | ORAL_TABLET | Freq: Every day | ORAL | 0 refills | Status: DC

## 2021-02-27 MED ORDER — WEGOVY 2.4 MG/0.75ML ~~LOC~~ SOAJ: 2.4000 mg | SUBCUTANEOUS | 0 refills | Status: DC

## 2021-02-27 MED ORDER — CHLORTHALIDONE 25 MG PO TABS
12.5000 mg | ORAL_TABLET | Freq: Every day | ORAL | 0 refills | Status: DC
Start: 2021-02-27 — End: 2021-03-27

## 2021-02-27 MED ORDER — VITAMIN D (ERGOCALCIFEROL) 1.25 MG (50000 UNIT) PO CAPS: 50000.0000 [IU] | ORAL_CAPSULE | ORAL | 0 refills | Status: DC

## 2021-03-06 NOTE — Progress Notes (Signed)
Chief Complaint:   OBESITY [name] is here to discuss her progress with her obesity treatment plan along with follow-up of her obesity related diagnoses. Sharon Robertson is on keeping a food journal and adhering to recommended goals of 1800 calories and 120 protein and states she is following her eating plan approximately 100% of the time. Sharon Robertson states she is walking and resistance training 60 minutes 3 times per week.  Today's visit was #: 21 Starting weight: 410 lbs Starting date: 02/09/2020 Today's weight: 358 lbs Today's date: 02/27/2021 Total lbs lost to date: 52  Total lbs lost since last in-office visit: 0  Interim History: Sharon Robertson does think she has a job and will be moving to [place] to do psych assessments at [facility]. She her last visit, she's journaling and hitting goal of 1800 cal and 120 g protein daily. She hasn't been getting hungry and hasn't had cravings. Pt is planning on going to the beach and maybe a cruise at some point.  Subjective:   1. Essential hypertension Sharon Robertson's BP is controlled today.  Pt denies chest pain, chest pressure and headache.  BP Readings from Last 3 Encounters:  02/27/21 (!) 147/82  02/07/21 132/86  01/24/21 127/80   2. Vitamin D deficiency Sharon Robertson denies nausea, vomiting, and muscle weakness but notes fatigue. Pt is on prescription Vit D. Her last Vit D level was 41.8.  Ref. Range 01/24/2021 13:28  Vitamin D, 25-Hydroxy Latest Ref Range: 30.0 - 100.0 ng/mL 41.8   3. At risk for side effect of medication Marshawn is at risk for side effects of medication due to increasing dose of Wegovy.  Assessment/Plan:   1. Essential hypertension Anesha is working on healthy weight loss and exercise to improve blood pressure control. We will watch for signs of hypotension as she continues her lifestyle modifications. Follow up BP at next appointment.  - chlorthalidone (HYGROTON) 25 MG tablet; Take 0.5 tablets (12.5 mg total) by mouth daily.  Dispense: 45 tablet; Refill:  0 - lisinopril (ZESTRIL) 5 MG tablet; Take 1 tablet (5 mg total) by mouth daily.  Dispense: 30 tablet; Refill: 0  2. Vitamin D deficiency Low Vitamin D level contributes to fatigue and are associated with obesity, breast, and colon cancer. She agrees to continue to take prescription Vitamin D @50 ,000 IU every week and will follow-up for routine testing of Vitamin D, at least 2-3 times per year to avoid over-replacement.  - Vitamin D, Ergocalciferol, (DRISDOL) 1.25 MG (50000 UNIT) CAPS capsule; Take 1 capsule (50,000 Units total) by mouth every 7 (seven) days.  Dispense: 4 capsule; Refill: 0  3. At risk for side effect of medication [name] was given approximately 15 minutes of drug side effect counseling today.  We discussed side effect possibility and risk versus benefits. Sharon Robertson agreed to the medication and will contact this office if these side effects are intolerable.  Repetitive spaced learning was employed today to elicit superior memory formation and behavioral change.  4. Class 3 severe obesity with serious comorbidity and body mass index (BMI) of 50.0 to 59.9 in adult, unspecified obesity type (HCC) Sharon Robertson is currently in the action stage of change. As such, her goal is to continue with weight loss efforts. She has agreed to keeping a food journal and adhering to recommended goals of 1800 calories and 120+ g protein.   We discussed various medication options to help Sharon Robertson with her weight loss efforts and we both agreed to increase Wegovy, as per below. - Semaglutide-Weight Management (  WEGOVY) 2.4 MG/0.75ML SOAJ; Inject 2.4 mg into the skin once a week.  Dispense: 3 mL; Refill: 0  Exercise goals:  As is  Behavioral modification strategies: increasing lean protein intake, meal planning and cooking strategies, keeping healthy foods in the home and keeping a strict food journal.  Sharon Robertson has agreed to follow-up with our clinic in 4 weeks. She was informed of the importance of frequent follow-up  visits to maximize her success with intensive lifestyle modifications for her multiple health conditions.   Objective:   Blood pressure (!) 147/82, pulse 90, temperature (!) 97 F (36.1 C), temperature source Oral, height 5\' 9"  (1.753 m), weight (!) 358 lb (162.4 kg), last menstrual period 02/05/2021, SpO2 99 %. Body mass index is 52.87 kg/m.  General: Cooperative, alert, well developed, in no acute distress. HEENT: Conjunctivae and lids unremarkable. Cardiovascular: Regular rhythm.  Lungs: Normal work of breathing. Neurologic: No focal deficits.   Lab Results  Component Value Date   CREATININE 0.67 01/24/2021   BUN 19 01/24/2021   NA 139 01/24/2021   K 4.3 01/24/2021   CL 100 01/24/2021   CO2 24 01/24/2021   Lab Results  Component Value Date   ALT 22 01/24/2021   AST 19 01/24/2021   ALKPHOS 97 01/24/2021   BILITOT 0.4 01/24/2021   Lab Results  Component Value Date   HGBA1C 5.7 (H) 01/24/2021   HGBA1C 6.0 (H) 05/23/2020   HGBA1C 5.9 (H) 01/04/2020   HGBA1C 5.9 (H) 07/05/2019   HGBA1C 5.6 03/20/2018   Lab Results  Component Value Date   INSULIN 8.5 01/24/2021   INSULIN 13.6 05/23/2020   Lab Results  Component Value Date   TSH 2.410 05/23/2020   Lab Results  Component Value Date   CHOL 189 01/24/2021   HDL 49 01/24/2021   LDLCALC 125 (H) 01/24/2021   TRIG 80 01/24/2021   CHOLHDL 3.9 01/24/2021   Lab Results  Component Value Date   WBC 8.5 05/23/2020   HGB 12.2 05/23/2020   HCT 38.9 05/23/2020   MCV 83 05/23/2020   PLT 396 05/23/2020   Lab Results  Component Value Date   IRON 93 02/09/2020   TIBC 363 02/09/2020   FERRITIN 42 02/09/2020     Attestation Statements:   Reviewed by clinician on day of visit: allergies, medications, problem list, medical history, surgical history, family history, social history, and previous encounter notes.  04/10/2020, am acting as transcriptionist for Edmund Hilda, MD.  I have reviewed the above  documentation for accuracy and completeness, and I agree with the above. - Reuben Likes, MD

## 2021-03-21 ENCOUNTER — Other Ambulatory Visit (INDEPENDENT_AMBULATORY_CARE_PROVIDER_SITE_OTHER): Payer: Self-pay | Admitting: Family Medicine

## 2021-03-21 DIAGNOSIS — I10 Essential (primary) hypertension: Secondary | ICD-10-CM

## 2021-03-22 ENCOUNTER — Other Ambulatory Visit (INDEPENDENT_AMBULATORY_CARE_PROVIDER_SITE_OTHER): Payer: Self-pay | Admitting: Family Medicine

## 2021-03-22 DIAGNOSIS — E559 Vitamin D deficiency, unspecified: Secondary | ICD-10-CM

## 2021-03-22 DIAGNOSIS — I10 Essential (primary) hypertension: Secondary | ICD-10-CM

## 2021-03-25 NOTE — Telephone Encounter (Signed)
Last seen Dr Marquis Lunch

## 2021-03-27 ENCOUNTER — Encounter (INDEPENDENT_AMBULATORY_CARE_PROVIDER_SITE_OTHER): Payer: Self-pay | Admitting: Family Medicine

## 2021-03-27 ENCOUNTER — Ambulatory Visit (INDEPENDENT_AMBULATORY_CARE_PROVIDER_SITE_OTHER): Payer: BC Managed Care – PPO | Admitting: Family Medicine

## 2021-03-27 ENCOUNTER — Other Ambulatory Visit: Payer: Self-pay

## 2021-03-27 VITALS — BP 112/75 | HR 78 | Temp 97.7°F | Ht 69.0 in | Wt 357.0 lb

## 2021-03-27 DIAGNOSIS — E559 Vitamin D deficiency, unspecified: Secondary | ICD-10-CM

## 2021-03-27 DIAGNOSIS — Z9189 Other specified personal risk factors, not elsewhere classified: Secondary | ICD-10-CM

## 2021-03-27 DIAGNOSIS — R7303 Prediabetes: Secondary | ICD-10-CM

## 2021-03-27 DIAGNOSIS — I10 Essential (primary) hypertension: Secondary | ICD-10-CM | POA: Diagnosis not present

## 2021-03-27 DIAGNOSIS — Z6841 Body Mass Index (BMI) 40.0 and over, adult: Secondary | ICD-10-CM

## 2021-03-27 DIAGNOSIS — E7849 Other hyperlipidemia: Secondary | ICD-10-CM | POA: Diagnosis not present

## 2021-03-27 MED ORDER — ATORVASTATIN CALCIUM 20 MG PO TABS: 20.0000 mg | ORAL_TABLET | Freq: Every day | ORAL | 0 refills | Status: DC

## 2021-03-27 MED ORDER — VITAMIN D (ERGOCALCIFEROL) 1.25 MG (50000 UNIT) PO CAPS: 50000.0000 [IU] | ORAL_CAPSULE | ORAL | 0 refills | Status: DC

## 2021-03-27 MED ORDER — LISINOPRIL 5 MG PO TABS: 5.0000 mg | ORAL_TABLET | Freq: Every day | ORAL | 0 refills | Status: DC

## 2021-03-27 MED ORDER — WEGOVY 2.4 MG/0.75ML ~~LOC~~ SOAJ
2.4000 mg | SUBCUTANEOUS | 0 refills | Status: DC
Start: 2021-03-27 — End: 2021-04-22

## 2021-03-27 MED ORDER — CHLORTHALIDONE 25 MG PO TABS: 12.5000 mg | ORAL_TABLET | Freq: Every day | ORAL | 0 refills | Status: DC

## 2021-04-03 IMAGING — MG MM DIGITAL SCREENING BILAT W/ TOMO W/ CAD
8 of 14 series · 8 of 40 positions shown · non-contrast
Comparison: None.

CLINICAL DATA: Screening.

EXAM:
DIGITAL SCREENING BILATERAL MAMMOGRAM WITH TOMO AND CAD

[R CC synth-2D]
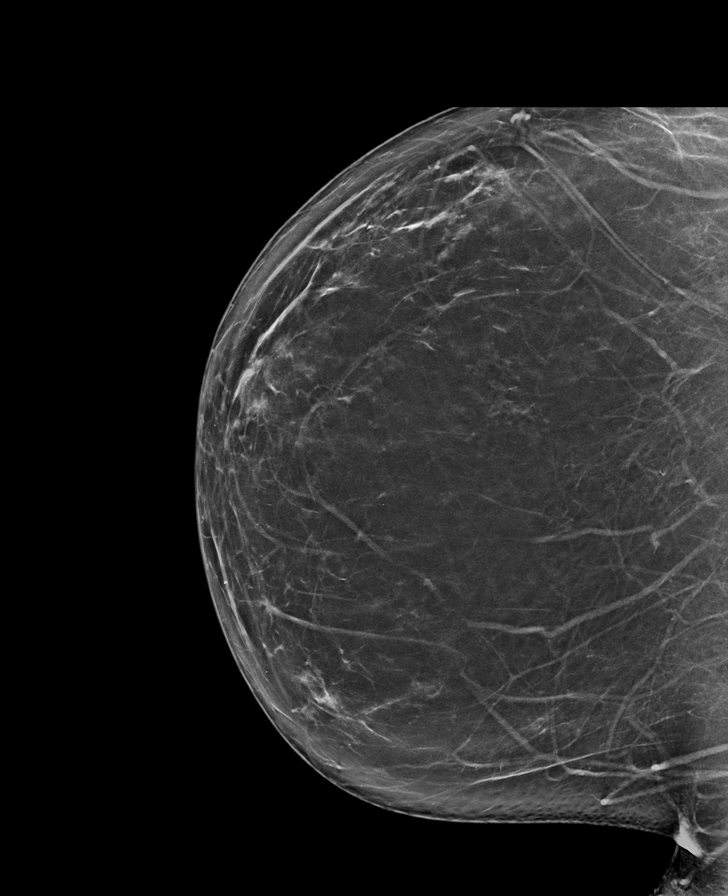

[R MLO synth-2D (1 of 2)]
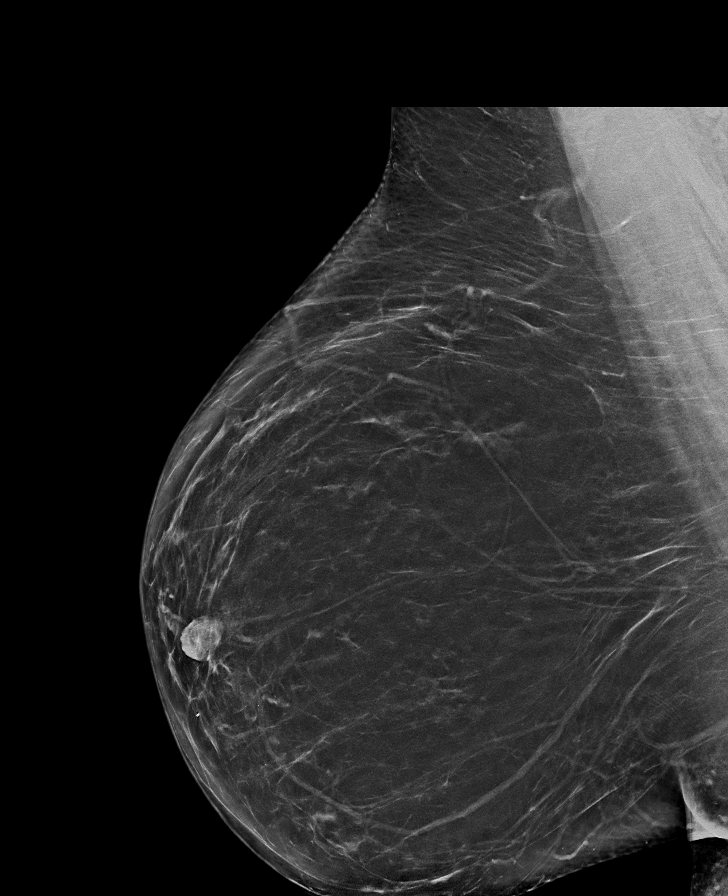

[R MLO synth-2D (2 of 2)]
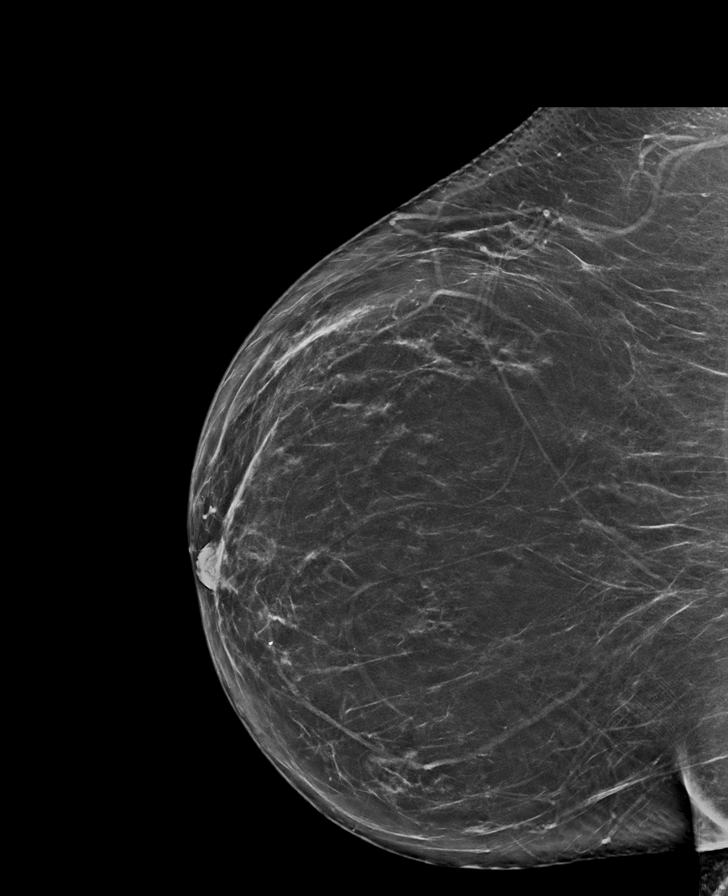

[L MLO synth-2D (1 of 2)]
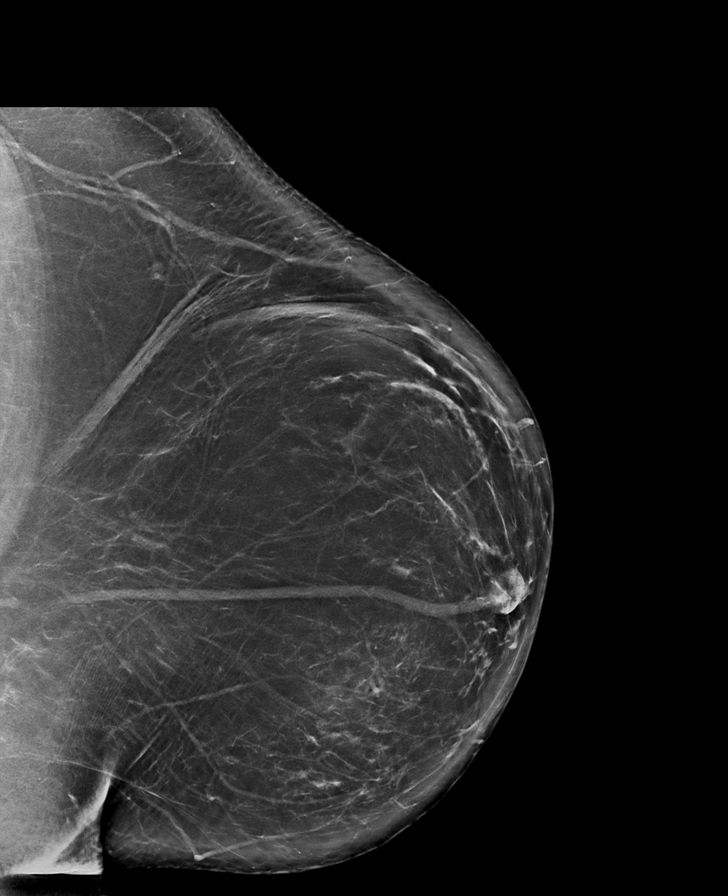

[L XCCL synth-2D]
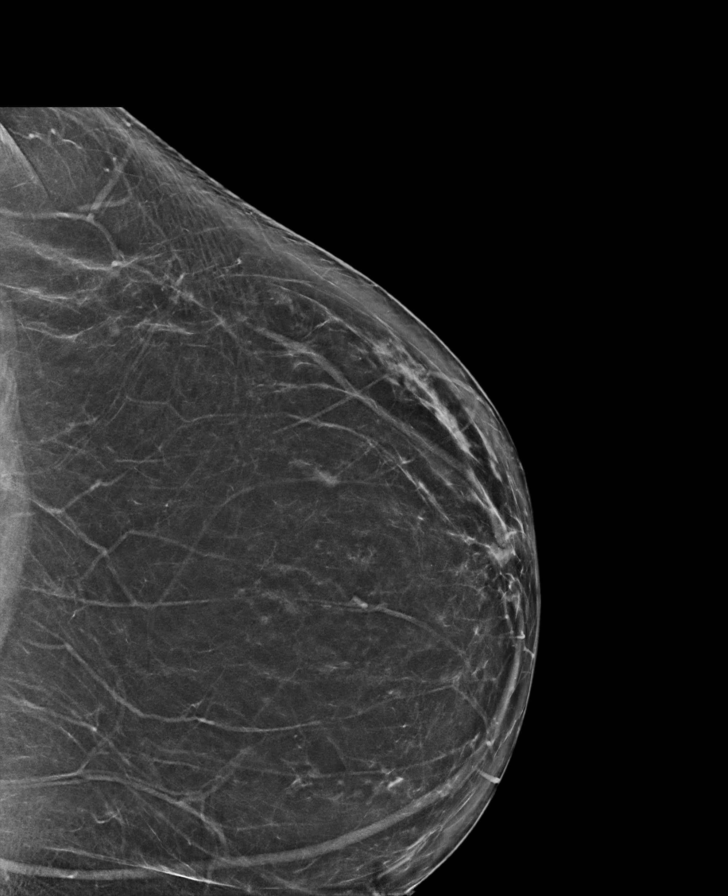

[L CC synth-2D]
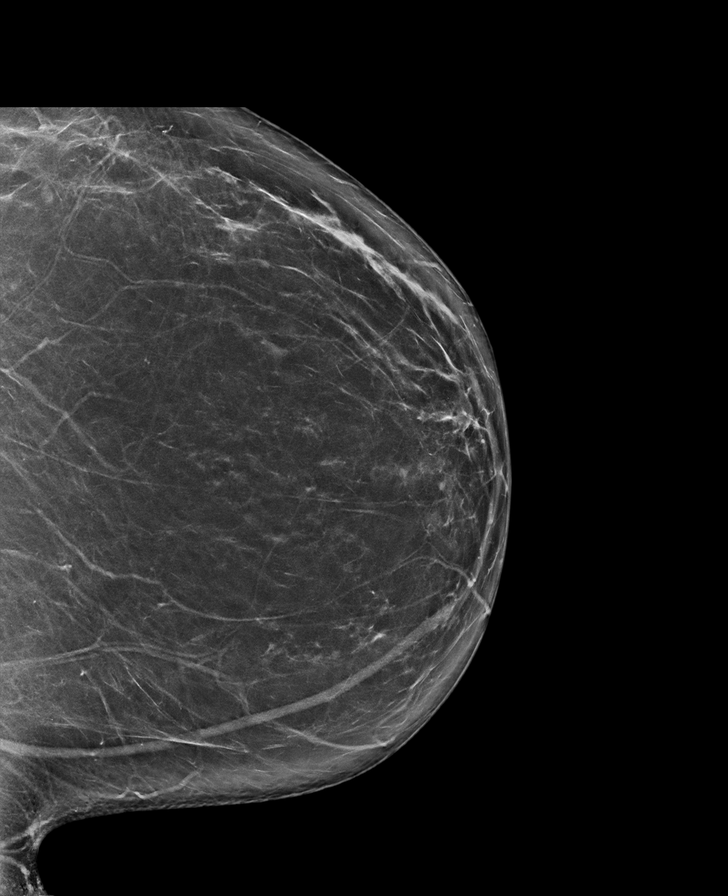

[L MLO synth-2D (2 of 2)]
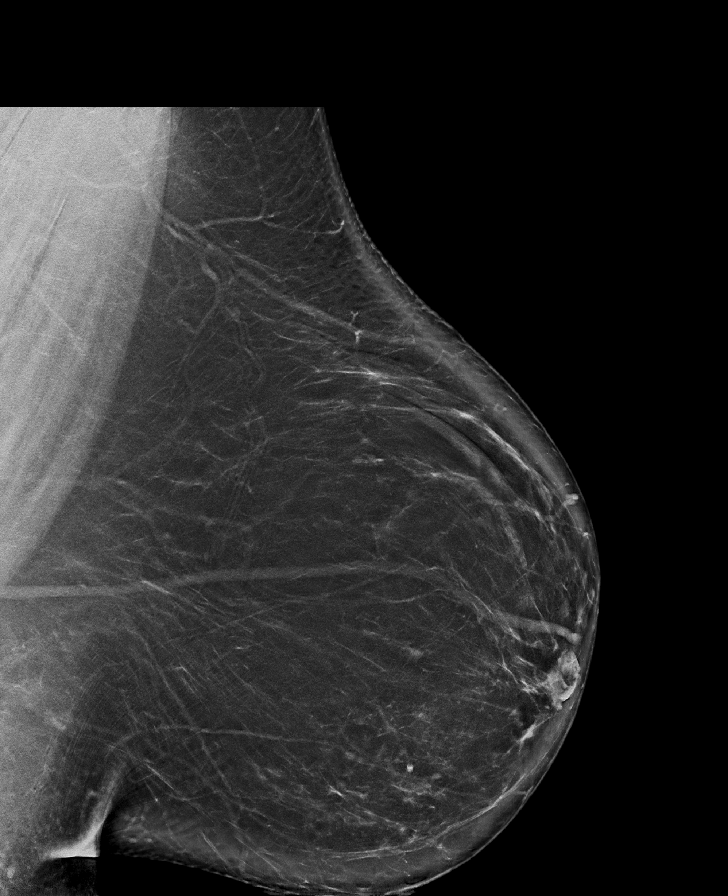

[L MLO tomo · tomo slice 53/104.0]
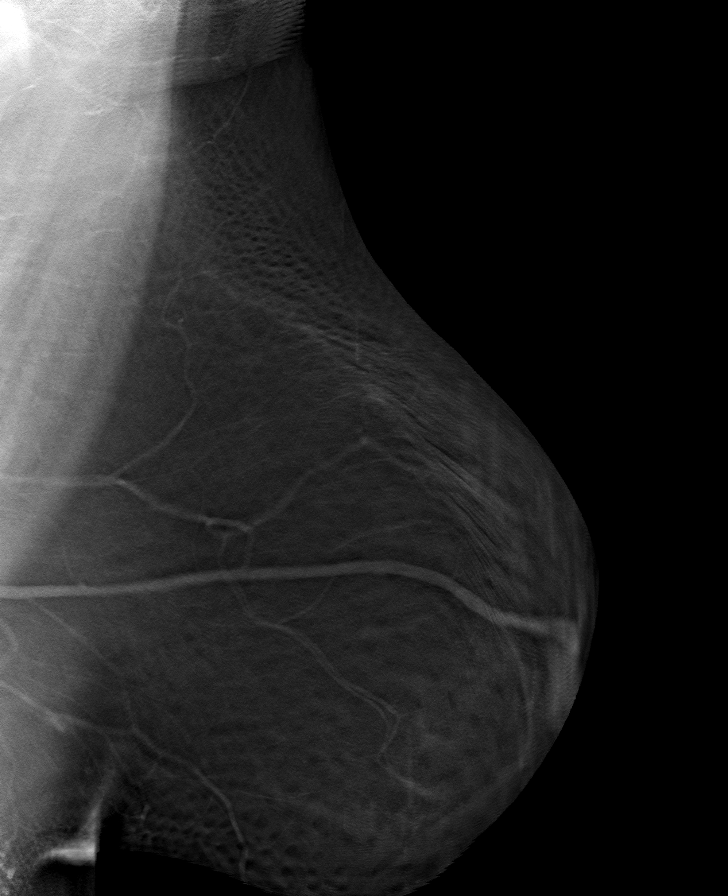

[8 of 40 positions shown; findings below may reference images not displayed]

ACR Breast Density Category b: There are scattered areas of
fibroglandular density.
FINDINGS: There are no findings suspicious for malignancy. Images were
processed with CAD.
IMPRESSION: No mammographic evidence of malignancy. A result letter of this
screening mammogram will be mailed directly to the patient.

RECOMMENDATION:
Screening mammogram in one year. (Code:Y5-G-EJ6)

BI-RADS CATEGORY  1: Negative.

## 2021-04-04 NOTE — Progress Notes (Signed)
Chief Complaint:   OBESITY Sharon Robertson is here to discuss her progress with her obesity treatment plan along with follow-up of her obesity related diagnoses.   Today's visit was #: 22 Starting weight: 410 lbs Starting date: 02/09/2020 Today's weight: 357 lbs Today's date: 03/27/2021 Total lbs lost to date: 53 lbs Body mass index is 52.72 kg/m.  Total weight loss percentage to date: -12.93%  Interim History:  Sharon Robertson is usually seen by Dr. Lawson Radar.  This is her first office visit with me.  She lost 1 pound since her last office visit.  She feels that she is doing well and like she is getting into a groove lately.  Denies cravings or hunger.  No binge eating.  She hits her calorie/protein goals everyday.  Plan:  Refill Wegovy.  Current Meal Plan: keeping a food journal and adhering to recommended goals of 1800 calories and 120 grams of protein for 100% of the time.  Current Exercise Plan: Walking and resistance bands for 30-60 minutes 3-4 times per week. Current Anti-Obesity Medications: Wegovy 2.4 mg subcutaneously weekly. Side effects: None.  Assessment/Plan:   1. Essential hypertension At goal. Medications: lisinopril 5 mg daily and chlorthalidone 12.5 mg daily.  Much improved from prior at 147/82.  Plan: At goal.  Avoid buying foods that are: processed, frozen, or prepackaged to avoid excess salt. Ambulatory blood pressure monitoring was encouraged with a goal of at least 2-3 times weekly or when feeling poorly.  She was instructed to keep a log for [place] to review at each office visit.   We will watch for signs of hypotension as she continues lifestyle modifications. We will continue to monitor closely alongside her PCP and/or Specialist.  Regular follow up with PCP and specialists was also encouraged.  Continue prudent nutritional plan and walking.  BP Readings from Last 3 Encounters:  03/27/21 112/75  02/27/21 (!) 147/82  02/07/21 132/86   Lab Results  Component Value Date    CREATININE 0.67 01/24/2021   - Refill lisinopril (ZESTRIL) 5 MG tablet; Take 1 tablet (5 mg total) by mouth daily.  Dispense: 30 tablet; Refill: 0 - Refill chlorthalidone (HYGROTON) 25 MG tablet; Take 0.5 tablets (12.5 mg total) by mouth daily.  Dispense: 45 tablet; Refill: 0  2. Vitamin D deficiency Not optimized. Current vitamin D is 41.8, tested on 01/24/2021. Optimal goal > 50 ng/dL.  She is taking vitamin D 50,000 IU weekly.  Plan: Continue to take prescription Vitamin D @50 ,000 IU every week as prescribed.  Follow-up for routine testing of Vitamin D, at least 2-3 times per year to avoid over-replacement.  - Refill Vitamin D, Ergocalciferol, (DRISDOL) 1.25 MG (50000 UNIT) CAPS capsule; Take 1 capsule (50,000 Units total) by mouth every 7 (seven) days.  Dispense: 4 capsule; Refill: 0  3. Other hyperlipidemia Course: Not at goal. Lipid-lowering medications: Lipitor 10 mg daily.  Labs drom 01/24/2021 reviewed with patient.  Still on same dose of Lipitor.  Plan: Discussed labs with patient today.  Dietary changes: Increase soluble fiber, decrease simple carbohydrates, decrease saturated fat. Exercise changes: Moderate to vigorous-intensity aerobic activity 150 minutes per week or as tolerated. We will continue to monitor along with PCP/specialists as it pertains to her weight loss journey.  Will increase Lipitor to 20 mg daily, as per below.  Continue prudent nutritional plan, exercise.  Recheck ALT in 6 weeks and cholesterol panel in 3 months.  Lab Results  Component Value Date   CHOL 189 01/24/2021  HDL 49 01/24/2021   LDLCALC 125 (H) 01/24/2021   TRIG 80 01/24/2021   CHOLHDL 3.9 01/24/2021   Lab Results  Component Value Date   ALT 22 01/24/2021   AST 19 01/24/2021   ALKPHOS 97 01/24/2021   BILITOT 0.4 01/24/2021   The 10-year ASCVD risk score Denman George DC Jr., et al., 2013) is: 1.3%   Values used to calculate the score:     Age: 50 years     Sex: Female     Is Non-Hispanic African  American: No     Diabetic: No     Tobacco smoker: No     Systolic Blood Pressure: 112 mmHg     Is BP treated: Yes     HDL Cholesterol: 49 mg/dL     Total Cholesterol: 189 mg/dL  - Increase atorvastatin (LIPITOR) 20 MG tablet; Take 1 tablet (20 mg total) by mouth at bedtime.  Dispense: 30 tablet; Refill: 0  4. At risk for side effect of medication Due to Alfred's current conditions and medications, she is at a higher risk for drug side effect as we increased her Lipitor dose today.  At least 10 minutes was spent on counseling her about these concerns today.  We discussed the benefits and potential risks of these medications, and all of patient's concerns were addressed and questions were answered.  she will call us, or their PCP or other specialists who treat their conditions with medications, with any questions or concerns that may develop.    5. Obesity, current BMI 52.7  - Refill Semaglutide-Weight Management (WEGOVY) 2.4 MG/0.75ML SOAJ; Inject 2.4 mg into the skin once a week.  Dispense: 3 mL; Refill: 0   Course: Sharon Robertson is currently in the action stage of change. As such, her goal is to continue with weight loss efforts.   Nutrition goals: She has agreed to keeping a food journal and adhering to recommended goals of 1800 calories and 120 grams of protein.   Exercise goals: As is.  Behavioral modification strategies: increasing lean protein intake, increasing water intake, meal planning and cooking strategies, emotional eating strategies and planning for success.  Sharon Robertson has agreed to follow-up with our clinic in 2-3 weeks. She was informed of the importance of frequent follow-up visits to maximize her success with intensive lifestyle modifications for her multiple health conditions.   Objective:   Blood pressure 112/75, pulse 78, temperature 97.7 F (36.5 C), height 5\' 9"  (1.753 m), weight (!) 357 lb (161.9 kg), SpO2 98 %. Body mass index is 52.72 kg/m.  General: Cooperative,  alert, well developed, in no acute distress. HEENT: Conjunctivae and lids unremarkable. Cardiovascular: Regular rhythm.  Lungs: Normal work of breathing. Neurologic: No focal deficits.   Lab Results  Component Value Date   CREATININE 0.67 01/24/2021   BUN 19 01/24/2021   NA 139 01/24/2021   K 4.3 01/24/2021   CL 100 01/24/2021   CO2 24 01/24/2021   Lab Results  Component Value Date   ALT 22 01/24/2021   AST 19 01/24/2021   ALKPHOS 97 01/24/2021   BILITOT 0.4 01/24/2021   Lab Results  Component Value Date   HGBA1C 5.7 (H) 01/24/2021   HGBA1C 6.0 (H) 05/23/2020   HGBA1C 5.9 (H) 01/04/2020   HGBA1C 5.9 (H) 07/05/2019   HGBA1C 5.6 03/20/2018   Lab Results  Component Value Date   INSULIN 8.5 01/24/2021   INSULIN 13.6 05/23/2020   Lab Results  Component Value Date   TSH 2.410 05/23/2020  Lab Results  Component Value Date   CHOL 189 01/24/2021   HDL 49 01/24/2021   LDLCALC 125 (H) 01/24/2021   TRIG 80 01/24/2021   CHOLHDL 3.9 01/24/2021   Lab Results  Component Value Date   WBC 8.5 05/23/2020   HGB 12.2 05/23/2020   HCT 38.9 05/23/2020   MCV 83 05/23/2020   PLT 396 05/23/2020   Lab Results  Component Value Date   IRON 93 02/09/2020   TIBC 363 02/09/2020   FERRITIN 42 02/09/2020   Attestation Statements:   Reviewed by clinician on day of visit: allergies, medications, problem list, medical history, surgical history, family history, social history, and previous encounter notes.  I, Insurance claims handler, CMA, am acting as Energy manager for [employer], DO.  I have reviewed the above documentation for accuracy and completeness, and I agree with the above. Carlye Grippe, D.O.  The 21st Century Cures Act was signed into law in 2016 which includes the topic of electronic health records.  This provides immediate access to information in MyChart.  This includes consultation notes, operative notes, office notes, lab results and pathology reports.  If you have  any questions about what you read please let us know at your next visit so we can discuss your concerns and take corrective action if need be.  We are right here with you.

## 2021-04-18 ENCOUNTER — Ambulatory Visit (INDEPENDENT_AMBULATORY_CARE_PROVIDER_SITE_OTHER): Payer: BC Managed Care – PPO | Admitting: Family Medicine

## 2021-04-18 ENCOUNTER — Encounter (INDEPENDENT_AMBULATORY_CARE_PROVIDER_SITE_OTHER): Payer: Self-pay

## 2021-04-22 ENCOUNTER — Other Ambulatory Visit: Payer: Self-pay

## 2021-04-22 ENCOUNTER — Encounter (INDEPENDENT_AMBULATORY_CARE_PROVIDER_SITE_OTHER): Payer: Self-pay | Admitting: Family Medicine

## 2021-04-22 ENCOUNTER — Ambulatory Visit (INDEPENDENT_AMBULATORY_CARE_PROVIDER_SITE_OTHER): Payer: BC Managed Care – PPO | Admitting: Family Medicine

## 2021-04-22 VITALS — BP 122/78 | HR 82 | Temp 97.8°F | Ht 69.0 in | Wt 347.0 lb

## 2021-04-22 DIAGNOSIS — E559 Vitamin D deficiency, unspecified: Secondary | ICD-10-CM

## 2021-04-22 DIAGNOSIS — E7849 Other hyperlipidemia: Secondary | ICD-10-CM | POA: Diagnosis not present

## 2021-04-22 DIAGNOSIS — I10 Essential (primary) hypertension: Secondary | ICD-10-CM

## 2021-04-22 DIAGNOSIS — Z6841 Body Mass Index (BMI) 40.0 and over, adult: Secondary | ICD-10-CM

## 2021-04-22 DIAGNOSIS — Z9189 Other specified personal risk factors, not elsewhere classified: Secondary | ICD-10-CM | POA: Diagnosis not present

## 2021-04-22 MED ORDER — ATORVASTATIN CALCIUM 20 MG PO TABS
20.0000 mg | ORAL_TABLET | Freq: Every day | ORAL | 0 refills | Status: DC
Start: 2021-04-22 — End: 2021-05-28

## 2021-04-22 MED ORDER — CHLORTHALIDONE 25 MG PO TABS: 12.5000 mg | ORAL_TABLET | Freq: Every day | ORAL | 0 refills | Status: DC

## 2021-04-22 MED ORDER — VITAMIN D (ERGOCALCIFEROL) 1.25 MG (50000 UNIT) PO CAPS: 50000.0000 [IU] | ORAL_CAPSULE | ORAL | 0 refills | Status: DC

## 2021-04-22 MED ORDER — WEGOVY 2.4 MG/0.75ML ~~LOC~~ SOAJ
2.4000 mg | SUBCUTANEOUS | 0 refills | Status: DC
Start: 2021-04-22 — End: 2023-05-14

## 2021-04-22 MED ORDER — LISINOPRIL 5 MG PO TABS: 5.0000 mg | ORAL_TABLET | Freq: Every day | ORAL | 0 refills | Status: DC

## 2021-04-23 NOTE — Progress Notes (Signed)
Chief Complaint:   OBESITY Sharon Robertson is here to discuss her progress with her obesity treatment plan along with follow-up of her obesity related diagnoses. Sharon Robertson is on keeping a food journal and adhering to recommended goals of 1800 calories and 120 g protein and states she is following her eating plan approximately 100% of the time. Sharon Robertson states she is walking and resistance training 30-60 minutes 6 times per week.  Today's visit was #: 23 Starting weight: 410 lbs Starting date: 02/09/2020 Today's weight: 347 lbs Today's date: 04/22/2021 Total lbs lost to date: 63 lbs Total lbs lost since last in-office visit: 10  Interim History: Sharon Robertson stats a new job June 13th and will do training here until July, then move to [place] for her job. She feels really focused with her journaling with finishing school and finding a job. Her biggest obstacle is transitioning from one job to the next. She is planning to take a break and go to the beach in the interim.  Subjective:   1. Essential hypertension BP well controlled. Sharon Robertson is on lisinopril and [place]. Pt denies chest pain/chest pressure/headache.  2. Other hyperlipidemia Sharon Robertson's LDL was 125 on last check. She denies myalgias.  3. Vitamin D deficiency Sharon Robertson denies nausea, vomiting, and muscle weakness but notes fatigue. Pt is on prescription Vit D. Her last Vit D level was 41.8.  4. At risk for side effect of medication Sharon Robertson is at risk for side effects of medication due to being on maximum dosage of Wegovy.  Assessment/Plan:   1. Essential hypertension Sharon Robertson is working on healthy weight loss and exercise to improve blood pressure control. We will watch for signs of hypotension as she continues her lifestyle modifications.  - chlorthalidone (HYGROTON) 25 MG tablet; Take 0.5 tablets (12.5 mg total) by mouth daily.  Dispense: 45 tablet; Refill: 0 - lisinopril (ZESTRIL) 5 MG tablet; Take 1 tablet (5 mg total) by mouth daily.  Dispense: 90  tablet; Refill: 0  2. Other hyperlipidemia Cardiovascular risk and specific lipid/LDL goals reviewed.  We discussed several lifestyle modifications today and Sharon Robertson will continue to work on diet, exercise and weight loss efforts. Orders and follow up as documented in patient record.   Counseling Intensive lifestyle modifications are the first line treatment for this issue. . Dietary changes: Increase soluble fiber. Decrease simple carbohydrates. . Exercise changes: Moderate to vigorous-intensity aerobic activity 150 minutes per week if tolerated. . Lipid-lowering medications: see documented in medical record.  - atorvastatin (LIPITOR) 20 MG tablet; Take 1 tablet (20 mg total) by mouth at bedtime.  Dispense: 30 tablet; Refill: 0  3. Vitamin D deficiency Low Vitamin D level contributes to fatigue and are associated with obesity, breast, and colon cancer. She agrees to continue to take prescription Vitamin D @50 ,000 IU every week and will follow-up for routine testing of Vitamin D, at least 2-3 times per year to avoid over-replacement. - Vitamin D, Ergocalciferol, (DRISDOL) 1.25 MG (50000 UNIT) CAPS capsule; Take 1 capsule (50,000 Units total) by mouth every 7 (seven) days.  Dispense: 4 capsule; Refill: 0  4. At risk for side effect of medication Sharon Robertson was given approximately 15 minutes of drug side effect counseling today.  We discussed side effect possibility and risk versus benefits. Sharon Robertson agreed to the medication and will contact this office if these side effects are intolerable.  Repetitive spaced learning was employed today to elicit superior memory formation and behavioral change.  5. Obesity, current BMI 52.7 Sharon Robertson is currently  in the action stage of change. As such, her goal is to continue with weight loss efforts. She has agreed to keeping a food journal and adhering to recommended goals of 1800 calories and 120+ grams protein.   We discussed various medication options to help Sharon Robertson  with her weight loss efforts and we both agreed to continue [place], as prescribed below. - Semaglutide-Weight Management (WEGOVY) 2.4 MG/0.75ML SOAJ; Inject 2.4 mg into the skin once a week.  Dispense: 3 mL; Refill: 0  Exercise goals: As is  Behavioral modification strategies: increasing lean protein intake, meal planning and cooking strategies, keeping healthy foods in the home and planning for success.  Sharon Robertson has agreed to follow-up with our clinic in 3-4 weeks. She was informed of the importance of frequent follow-up visits to maximize her success with intensive lifestyle modifications for her multiple health conditions.   Objective:   Blood pressure 122/78, pulse 82, temperature 97.8 F (36.6 C), temperature source Oral, height 5\' 9"  (1.753 m), weight (!) 347 lb (157.4 kg), SpO2 97 %. Body mass index is 51.24 kg/m.  General: Cooperative, alert, well developed, in no acute distress. HEENT: Conjunctivae and lids unremarkable. Cardiovascular: Regular rhythm.  Lungs: Normal work of breathing. Neurologic: No focal deficits.   Lab Results  Component Value Date   CREATININE 0.67 01/24/2021   BUN 19 01/24/2021   NA 139 01/24/2021   K 4.3 01/24/2021   CL 100 01/24/2021   CO2 24 01/24/2021   Lab Results  Component Value Date   ALT 22 01/24/2021   AST 19 01/24/2021   ALKPHOS 97 01/24/2021   BILITOT 0.4 01/24/2021   Lab Results  Component Value Date   HGBA1C 5.7 (H) 01/24/2021   HGBA1C 6.0 (H) 05/23/2020   HGBA1C 5.9 (H) 01/04/2020   HGBA1C 5.9 (H) 07/05/2019   HGBA1C 5.6 03/20/2018   Lab Results  Component Value Date   INSULIN 8.5 01/24/2021   INSULIN 13.6 05/23/2020   Lab Results  Component Value Date   TSH 2.410 05/23/2020   Lab Results  Component Value Date   CHOL 189 01/24/2021   HDL 49 01/24/2021   LDLCALC 125 (H) 01/24/2021   TRIG 80 01/24/2021   CHOLHDL 3.9 01/24/2021   Lab Results  Component Value Date   WBC 8.5 05/23/2020   HGB 12.2 05/23/2020    HCT 38.9 05/23/2020   MCV 83 05/23/2020   PLT 396 05/23/2020   Lab Results  Component Value Date   IRON 93 02/09/2020   TIBC 363 02/09/2020   FERRITIN 42 02/09/2020     Attestation Statements:   Reviewed by clinician on day of visit: allergies, medications, problem list, medical history, surgical history, family history, social history, and previous encounter notes.  04/10/2020, CMA, am acting as transcriptionist for Edmund Hilda, MD.   I have reviewed the above documentation for accuracy and completeness, and I agree with the above. - Reuben Likes, MD

## 2021-05-17 ENCOUNTER — Other Ambulatory Visit (INDEPENDENT_AMBULATORY_CARE_PROVIDER_SITE_OTHER): Payer: Self-pay | Admitting: Family Medicine

## 2021-05-17 DIAGNOSIS — E559 Vitamin D deficiency, unspecified: Secondary | ICD-10-CM

## 2021-05-17 DIAGNOSIS — Z6841 Body Mass Index (BMI) 40.0 and over, adult: Secondary | ICD-10-CM

## 2021-05-17 DIAGNOSIS — E7849 Other hyperlipidemia: Secondary | ICD-10-CM

## 2021-05-17 DIAGNOSIS — I10 Essential (primary) hypertension: Secondary | ICD-10-CM

## 2021-05-20 ENCOUNTER — Ambulatory Visit (INDEPENDENT_AMBULATORY_CARE_PROVIDER_SITE_OTHER): Payer: BC Managed Care – PPO | Admitting: Family Medicine

## 2021-05-20 NOTE — Telephone Encounter (Signed)
Dr.Ukleja 

## 2021-05-21 ENCOUNTER — Other Ambulatory Visit (INDEPENDENT_AMBULATORY_CARE_PROVIDER_SITE_OTHER): Payer: Self-pay | Admitting: Family Medicine

## 2021-05-21 DIAGNOSIS — Z6841 Body Mass Index (BMI) 40.0 and over, adult: Secondary | ICD-10-CM

## 2021-05-21 DIAGNOSIS — E559 Vitamin D deficiency, unspecified: Secondary | ICD-10-CM

## 2021-05-21 DIAGNOSIS — E7849 Other hyperlipidemia: Secondary | ICD-10-CM

## 2021-05-28 ENCOUNTER — Other Ambulatory Visit (INDEPENDENT_AMBULATORY_CARE_PROVIDER_SITE_OTHER): Payer: Self-pay | Admitting: Family Medicine

## 2021-05-28 ENCOUNTER — Encounter (INDEPENDENT_AMBULATORY_CARE_PROVIDER_SITE_OTHER): Payer: Self-pay | Admitting: Family Medicine

## 2021-05-28 DIAGNOSIS — E559 Vitamin D deficiency, unspecified: Secondary | ICD-10-CM

## 2021-05-28 DIAGNOSIS — E7849 Other hyperlipidemia: Secondary | ICD-10-CM

## 2021-05-28 NOTE — Telephone Encounter (Signed)
Dr.Ukleja 

## 2021-05-28 NOTE — Telephone Encounter (Signed)
Last OV with Dr Ukleja 

## 2021-05-29 MED ORDER — ATORVASTATIN CALCIUM 20 MG PO TABS: 20.0000 mg | ORAL_TABLET | Freq: Every day | ORAL | 0 refills | Status: DC

## 2021-06-06 ENCOUNTER — Ambulatory Visit (INDEPENDENT_AMBULATORY_CARE_PROVIDER_SITE_OTHER): Payer: BC Managed Care – PPO | Admitting: Family Medicine

## 2021-06-18 ENCOUNTER — Encounter (INDEPENDENT_AMBULATORY_CARE_PROVIDER_SITE_OTHER): Payer: Self-pay

## 2021-06-19 ENCOUNTER — Other Ambulatory Visit: Payer: Self-pay | Admitting: Neurology

## 2021-06-20 ENCOUNTER — Ambulatory Visit (INDEPENDENT_AMBULATORY_CARE_PROVIDER_SITE_OTHER): Payer: BC Managed Care – PPO | Admitting: Family Medicine

## 2021-06-27 ENCOUNTER — Other Ambulatory Visit (INDEPENDENT_AMBULATORY_CARE_PROVIDER_SITE_OTHER): Payer: Self-pay | Admitting: Family Medicine

## 2021-06-27 ENCOUNTER — Encounter (INDEPENDENT_AMBULATORY_CARE_PROVIDER_SITE_OTHER): Payer: Self-pay

## 2021-06-27 DIAGNOSIS — E7849 Other hyperlipidemia: Secondary | ICD-10-CM

## 2021-06-27 DIAGNOSIS — E559 Vitamin D deficiency, unspecified: Secondary | ICD-10-CM

## 2021-06-27 NOTE — Telephone Encounter (Signed)
Dr.Ukleja 

## 2021-06-27 NOTE — Telephone Encounter (Signed)
Message sent to pt-CAS 

## 2021-07-03 ENCOUNTER — Other Ambulatory Visit: Payer: Self-pay | Admitting: Neurology

## 2021-07-03 ENCOUNTER — Ambulatory Visit (INDEPENDENT_AMBULATORY_CARE_PROVIDER_SITE_OTHER): Payer: Self-pay | Admitting: Family Medicine

## 2021-10-03 ENCOUNTER — Other Ambulatory Visit (INDEPENDENT_AMBULATORY_CARE_PROVIDER_SITE_OTHER): Payer: Self-pay | Admitting: Family Medicine

## 2021-10-03 DIAGNOSIS — I10 Essential (primary) hypertension: Secondary | ICD-10-CM

## 2022-07-09 ENCOUNTER — Encounter (INDEPENDENT_AMBULATORY_CARE_PROVIDER_SITE_OTHER): Payer: Self-pay

## 2023-04-23 DIAGNOSIS — Z0289 Encounter for other administrative examinations: Secondary | ICD-10-CM

## 2023-05-14 ENCOUNTER — Encounter (INDEPENDENT_AMBULATORY_CARE_PROVIDER_SITE_OTHER): Payer: Self-pay | Admitting: Family Medicine

## 2023-05-14 ENCOUNTER — Ambulatory Visit (INDEPENDENT_AMBULATORY_CARE_PROVIDER_SITE_OTHER): Payer: BC Managed Care – PPO | Admitting: Family Medicine

## 2023-05-14 VITALS — BP 171/89 | HR 96 | Temp 97.8°F | Ht 68.0 in | Wt >= 6400 oz

## 2023-05-14 DIAGNOSIS — R600 Localized edema: Secondary | ICD-10-CM | POA: Diagnosis not present

## 2023-05-14 DIAGNOSIS — Z6841 Body Mass Index (BMI) 40.0 and over, adult: Secondary | ICD-10-CM

## 2023-05-14 DIAGNOSIS — R7303 Prediabetes: Secondary | ICD-10-CM

## 2023-05-14 DIAGNOSIS — I10 Essential (primary) hypertension: Secondary | ICD-10-CM

## 2023-05-14 DIAGNOSIS — E7849 Other hyperlipidemia: Secondary | ICD-10-CM

## 2023-05-14 DIAGNOSIS — E559 Vitamin D deficiency, unspecified: Secondary | ICD-10-CM

## 2023-05-14 DIAGNOSIS — R5383 Other fatigue: Secondary | ICD-10-CM

## 2023-05-14 DIAGNOSIS — F32A Depression, unspecified: Secondary | ICD-10-CM

## 2023-05-14 DIAGNOSIS — G4733 Obstructive sleep apnea (adult) (pediatric): Secondary | ICD-10-CM

## 2023-05-14 DIAGNOSIS — F419 Anxiety disorder, unspecified: Secondary | ICD-10-CM

## 2023-05-14 DIAGNOSIS — R0602 Shortness of breath: Secondary | ICD-10-CM

## 2023-05-14 MED ORDER — LISINOPRIL 5 MG PO TABS: 5.0000 mg | ORAL_TABLET | Freq: Every day | ORAL | 0 refills | Status: DC

## 2023-05-14 MED ORDER — SERTRALINE HCL 50 MG PO TABS
50.0000 mg | ORAL_TABLET | Freq: Every day | ORAL | 0 refills | Status: DC
Start: 2023-05-14 — End: 2023-06-11

## 2023-05-14 MED ORDER — CHLORTHALIDONE 25 MG PO TABS: 25.0000 mg | ORAL_TABLET | Freq: Every day | ORAL | 0 refills | Status: DC

## 2023-05-14 NOTE — Progress Notes (Signed)
Chief Complaint:   Sharon Robertson (MR# [id number]) is a 52 y.o. female who presents for evaluation and treatment of Sharon and related comorbidities. Current BMI is Body mass index is 62.19 kg/m. Sharon Robertson has been struggling with her weight for many years and has been unsuccessful in either losing weight, maintaining weight loss, or reaching her healthy weight goal.  Sharon Robertson is currently in the action stage of change and ready to dedicate time achieving and maintaining a healthier weight. Sharon Robertson is interested in becoming our patient and working on intensive lifestyle modifications including (but not limited to) diet and exercise for weight loss.  Returning patient.  Last appointment was 04/22/21.  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  She returned today as she feels like she completely fell off the wagon in terms of food.  She is anticipating self sabotage and sabotage from her dad.  She speaks to her father 1x a week and sees him about once every other week. Desired weight of 250lbs.  Last time she was that weight was in high school.  Did Mediterrnean after this.  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  She is in the office 3 days a week and remote 1 day a week.   Food Recall: 20oz coffee with splenda and 2% milk (1/4 cup).  Then egg bites from [employer] with spinach and bacon ones (2) and feels satisfied.  Has liter water bottle that she drinks throughout the day.  She does eat Atkins protein bar chocolate caramel with peanuts during day of work. Bar gets her thru the day.  Gets home around 7pm then gets 1 chicken breast(4oz), 1 cup rice, 1 cup brussel sprouts or broccoli (feels somewhat satisfied).  Then will have mixed nuts (1/2 cup), parmesan crisps and a nectarine or apple (still doesn't feel full).  [name]'s habits were reviewed today and are as follows: Her family eats meals together, her desired weight loss is 159 lbs, she has  been heavy most of her life, she started gaining weight in middle school, her heaviest weight ever was 420 pounds, she has significant food cravings issues, she skips meals frequently, she frequently makes poor food choices, she frequently eats larger portions than normal, she has binge eating behaviors, and she struggles with emotional eating.  Depression Screen Willetta's Food and Mood (modified PHQ-9) score was 15.  Subjective:   1. Other fatigue Hser admits to daytime somnolence and admits to waking up still tired. Patient has a history of symptoms of daytime fatigue and morning fatigue. Tieisha generally gets 6 hours of sleep per night, and states that she has nightime awakenings and generally restful sleep. Snoring is not present. Apneic episodes are not present. Epworth Sleepiness Score is 7.  EKG sinus rhythm with voltage criteria of LVH on I and III.   2. SOBOE (shortness of breath on exertion) French Ana notes increasing shortness of breath with exercising and seems to be worsening over time with weight gain. She notes getting out of breath sooner with activity than she used to. This has not gotten worse recently. Ashawnti denies shortness of breath at rest or orthopnea.  3. Bilateral lower extremity edema Patient has 3+ pitting edema on lower legs, bilateral up to mid shin. No erythema. Calves and ankles symmetrically edematous.   4. Prediabetes Patient's last A1c was 5.7, and she took [place] previously.  5. Other hyperlipidemia Patient is not on medications anymore.  She was previously on atorvastatin.  6. Vitamin D deficiency Patient vitamin D level was previously low.  Last vitamin D level of 41.8.  She notes fatigue.  7. Essential hypertension Patient blood pressure elevated today.  She denies chest pressure, headache and chest pain.  She mentions she hasn't been taking medication as consistently due to follow up with a pcp.  8. OSA (obstructive sleep apnea) Patient wears her CPAP 3-4  times per week.  She realizes she is not as compliant as she should have been.  9. Anxiety and depression Patient was previously on Lexapro.  She denies suicidal or homicidal ideations.  Assessment/Plan:   1. Other fatigue Sharon Robertson does feel that her weight is causing her energy to be lower than it should be. Fatigue may be related to Sharon, depression or many other causes. Labs will be ordered, and in the meanwhile, Mavel will focus on self care including making healthy food choices, increasing physical activity and focusing on stress reduction.  - EKG 12-Lead - Vitamin B12 - Folate - TSH - T4, free - T3  2. SOBOE (shortness of breath on exertion) Jasibe does feel that she gets out of breath more easily that she used to when she exercises. Sharon Robertson's shortness of breath appears to be Sharon related and exercise induced. She has agreed to work on weight loss and gradually increase exercise to treat her exercise induced shortness of breath. Will continue to monitor closely.  - CBC with Differential/Platelet  3. Bilateral lower extremity edema Echocardiogram was ordered.  - ECHOCARDIOGRAM COMPLETE; Future  4. Prediabetes We will check labs today, we will follow-up at the patient's next appointment.  - Comprehensive metabolic panel - Hemoglobin A1c - Insulin, random  5. Other hyperlipidemia We will check labs today, we will follow-up at the patient's next appointment.  - Lipid Panel With LDL/HDL Ratio  6. Vitamin D deficiency We will check labs today, we will follow-up at the patient's next appointment.  - VITAMIN D 25 Hydroxy (Vit-D Deficiency, Fractures)  7. Essential hypertension Will refill prior medications at same doses.  Need to follow up on blood pressure at subsequent appointments and make medication dosage adjustments pending pressures. - chlorthalidone (HYGROTON) 25 MG tablet; Take 1 tablet (25 mg total) by mouth daily.  Dispense: 30 tablet; Refill: 0 - lisinopril  (ZESTRIL) 5 MG tablet; Take 1 tablet (5 mg total) by mouth daily.  Dispense: 30 tablet; Refill: 0  8. OSA (obstructive sleep apnea) Patient was encouraged to wear her CPAP nightly, and we will follow-up on her compliance at her next appointment.  9. Anxiety and depression Patient was referred to Dr. Dewaine Conger, our Bariatric Psychologist, for evaluation.  Patient agreed to start sertraline 25 mg once daily for 7 days then increase to 50 mg daily with no refills.  - sertraline (ZOLOFT) 50 MG tablet; Take 1 tablet (50 mg total) by mouth daily.  Dispense: 30 tablet; Refill: 0  10. BMI 60.0-69.9, adult (HCC)  11. Sharon with starting BMI of 60.5 Madgeline is currently in the action stage of change and her goal is to continue with weight loss efforts. I recommend Nawaal begin the structured treatment plan as follows:  She has agreed to the Category 4 Plan.  Exercise goals: No exercise has been prescribed at this time.   Behavioral modification strategies: increasing lean protein intake, meal planning and cooking strategies, keeping healthy foods in the home, and planning for success.  She was informed of the importance of frequent follow-up  visits to maximize her success with intensive lifestyle modifications for her multiple health conditions. She was informed we would discuss her lab results at her next visit unless there is a critical issue that needs to be addressed sooner. Jerrye agreed to keep her next visit at the agreed upon time to discuss these results.  Objective:   Blood pressure (!) 171/89, pulse 96, temperature 97.8 F (36.6 C), height 5\' 8"  (1.727 m), weight (!) 409 lb (185.5 kg), last menstrual period 11/22/2022, SpO2 97 %. Body mass index is 62.19 kg/m.  EKG: Normal sinus rhythm, rate 87 BPM.  Indirect Calorimeter completed today shows a VO2 of 416 and a REE of 2880.  Her calculated basal metabolic rate is 1610 thus her basal metabolic rate is better than expected.  General:  Cooperative, alert, well developed, in no acute distress. HEENT: Conjunctivae and lids unremarkable. Cardiovascular: Regular rhythm.  Lungs: Normal work of breathing. Neurologic: No focal deficits.   Lab Results  Component Value Date   CREATININE 0.64 05/14/2023   BUN 13 05/14/2023   NA 141 05/14/2023   K 4.9 05/14/2023   CL 104 05/14/2023   CO2 23 05/14/2023   Lab Results  Component Value Date   ALT 16 05/14/2023   AST 17 05/14/2023   ALKPHOS 109 05/14/2023   BILITOT 0.4 05/14/2023   Lab Results  Component Value Date   HGBA1C 5.9 (H) 05/14/2023   HGBA1C 5.7 (H) 01/24/2021   HGBA1C 6.0 (H) 05/23/2020   HGBA1C 5.9 (H) 01/04/2020   HGBA1C 5.9 (H) 07/05/2019   Lab Results  Component Value Date   INSULIN 11.1 05/14/2023   INSULIN 8.5 01/24/2021   INSULIN 13.6 05/23/2020   Lab Results  Component Value Date   TSH 2.210 05/14/2023   Lab Results  Component Value Date   CHOL 240 (H) 05/14/2023   HDL 52 05/14/2023   LDLCALC 169 (H) 05/14/2023   TRIG 109 05/14/2023   CHOLHDL 3.9 01/24/2021   Lab Results  Component Value Date   WBC 7.6 05/14/2023   HGB 12.5 05/14/2023   HCT 39.8 05/14/2023   MCV 80 05/14/2023   PLT 461 (H) 05/14/2023   Lab Results  Component Value Date   IRON 93 02/09/2020   TIBC 363 02/09/2020   FERRITIN 42 02/09/2020   Attestation Statements:   Reviewed by clinician on day of visit: allergies, medications, problem list, medical history, surgical history, family history, social history, and previous encounter notes.  Time spent on visit including pre-visit chart review and post-visit charting and care was 55 minutes.   I, Burt Knack, am acting as transcriptionist for Reuben Likes, MD.  This is the patient's first visit at [facility]. The patient's NEW PATIENT PACKET was reviewed at length. Included in the packet: current and past health history, medications, allergies, ROS, gynecologic history (women only), surgical  history, family history, social history, weight history, weight loss surgery history (for those that have had weight loss surgery), nutritional evaluation, mood and food questionnaire, PHQ9, Epworth questionnaire, sleep habits questionnaire, patient life and health improvement goals questionnaire. These will all be scanned into the patient's chart under media.   During the visit, I independently reviewed the patient's EKG, bioimpedance scale results, and indirect calorimeter results. I used this information to tailor a meal plan for the patient that will help her to lose weight and will improve her Sharon-related conditions going forward. I performed a medically necessary appropriate examination and/or evaluation. I discussed the assessment  and treatment plan with the patient. The patient was provided an opportunity to ask questions and all were answered. The patient agreed with the plan and demonstrated an understanding of the instructions. Labs were ordered at this visit and will be reviewed at the next visit unless more critical results need to be addressed immediately. Clinical information was updated and documented in the EMR.   I have reviewed the above documentation for accuracy and completeness, and I agree with the above. - Reuben Likes, MD

## 2023-05-15 LAB — T3: T3, Total: 150 ng/dL (ref 71–180)

## 2023-05-15 LAB — T4, FREE: Free T4: 1.09 ng/dL (ref 0.82–1.77)

## 2023-05-19 LAB — CBC WITH DIFFERENTIAL/PLATELET
Basophils Absolute: 0.1 10*3/uL (ref 0.0–0.2)
Basos: 1 %
EOS (ABSOLUTE): 0.2 10*3/uL (ref 0.0–0.4)
Eos: 2 %
Hematocrit: 39.8 % (ref 34.0–46.6)
Hemoglobin: 12.5 g/dL (ref 11.1–15.9)
Immature Grans (Abs): 0 10*3/uL (ref 0.0–0.1)
Immature Granulocytes: 0 %
Lymphocytes Absolute: 1.7 10*3/uL (ref 0.7–3.1)
Lymphs: 22 %
MCH: 25.1 pg — ABNORMAL LOW (ref 26.6–33.0)
MCHC: 31.4 g/dL — ABNORMAL LOW (ref 31.5–35.7)
MCV: 80 fL (ref 79–97)
Monocytes Absolute: 0.4 10*3/uL (ref 0.1–0.9)
Monocytes: 5 %
Neutrophils Absolute: 5.3 10*3/uL (ref 1.4–7.0)
Neutrophils: 70 %
Platelets: 461 10*3/uL — ABNORMAL HIGH (ref 150–450)
RBC: 4.98 x10E6/uL (ref 3.77–5.28)
RDW: 14.2 % (ref 11.7–15.4)
WBC: 7.6 10*3/uL (ref 3.4–10.8)

## 2023-05-19 LAB — FOLATE: Folate: 4.3 ng/mL (ref >3.0)

## 2023-05-19 LAB — TSH: TSH: 2.21 u[IU]/mL (ref 0.450–4.500)

## 2023-05-19 LAB — COMPREHENSIVE METABOLIC PANEL
ALT: 16 IU/L (ref 0–32)
AST: 17 IU/L (ref 0–40)
Albumin/Globulin Ratio: 1.4
Albumin: 4.2 g/dL (ref 3.8–4.9)
Alkaline Phosphatase: 109 IU/L (ref 44–121)
BUN/Creatinine Ratio: 20 (ref 9–23)
BUN: 13 mg/dL (ref 6–24)
Bilirubin Total: 0.4 mg/dL (ref 0.0–1.2)
CO2: 23 mmol/L (ref 20–29)
Calcium: 9.3 mg/dL (ref 8.7–10.2)
Chloride: 104 mmol/L (ref 96–106)
Creatinine, Ser: 0.64 mg/dL (ref 0.57–1.00)
Globulin, Total: 3 g/dL (ref 1.5–4.5)
Glucose: 94 mg/dL (ref 70–99)
Potassium: 4.9 mmol/L (ref 3.5–5.2)
Sodium: 141 mmol/L (ref 134–144)
Total Protein: 7.2 g/dL (ref 6.0–8.5)
eGFR: 106 mL/min/{1.73_m2} (ref >59)

## 2023-05-19 LAB — VITAMIN D 25 HYDROXY (VIT D DEFICIENCY, FRACTURES): Vit D, 25-Hydroxy: 14.9 ng/mL — ABNORMAL LOW (ref 30.0–100.0)

## 2023-05-19 LAB — LIPID PANEL WITH LDL/HDL RATIO
Cholesterol, Total: 240 mg/dL — ABNORMAL HIGH (ref 100–199)
HDL: 52 mg/dL (ref >39)
LDL Chol Calc (NIH): 169 mg/dL — ABNORMAL HIGH (ref 0–99)
LDL/HDL Ratio: 3.3 ratio — ABNORMAL HIGH (ref 0.0–3.2)
Triglycerides: 109 mg/dL (ref 0–149)
VLDL Cholesterol Cal: 19 mg/dL (ref 5–40)

## 2023-05-19 LAB — HEMOGLOBIN A1C
Est. average glucose Bld gHb Est-mCnc: 123 mg/dL
Hgb A1c MFr Bld: 5.9 % — ABNORMAL HIGH (ref 4.8–5.6)

## 2023-05-19 LAB — VITAMIN B12: Vitamin B-12: 326 pg/mL (ref 232–1245)

## 2023-05-19 LAB — INSULIN, RANDOM: INSULIN: 11.1 u[IU]/mL (ref 2.6–24.9)

## 2023-05-27 ENCOUNTER — Ambulatory Visit (INDEPENDENT_AMBULATORY_CARE_PROVIDER_SITE_OTHER): Payer: BC Managed Care – PPO | Admitting: Family Medicine

## 2023-05-27 ENCOUNTER — Encounter (INDEPENDENT_AMBULATORY_CARE_PROVIDER_SITE_OTHER): Payer: Self-pay | Admitting: Family Medicine

## 2023-05-27 VITALS — BP 132/84 | HR 79 | Temp 97.6°F | Ht 68.0 in | Wt 396.0 lb

## 2023-05-27 DIAGNOSIS — R7303 Prediabetes: Secondary | ICD-10-CM | POA: Diagnosis not present

## 2023-05-27 DIAGNOSIS — E559 Vitamin D deficiency, unspecified: Secondary | ICD-10-CM | POA: Diagnosis not present

## 2023-05-27 DIAGNOSIS — F3289 Other specified depressive episodes: Secondary | ICD-10-CM

## 2023-05-27 DIAGNOSIS — Z6841 Body Mass Index (BMI) 40.0 and over, adult: Secondary | ICD-10-CM

## 2023-05-27 DIAGNOSIS — E7849 Other hyperlipidemia: Secondary | ICD-10-CM | POA: Diagnosis not present

## 2023-05-27 DIAGNOSIS — I10 Essential (primary) hypertension: Secondary | ICD-10-CM

## 2023-05-27 DIAGNOSIS — E669 Obesity, unspecified: Secondary | ICD-10-CM

## 2023-05-27 MED ORDER — VITAMIN D (ERGOCALCIFEROL) 1.25 MG (50000 UNIT) PO CAPS
50000.0000 [IU] | ORAL_CAPSULE | ORAL | 0 refills | Status: DC
Start: 2023-05-27 — End: 2023-06-11

## 2023-05-27 NOTE — Progress Notes (Signed)
Chief Complaint:   OBESITY Sharon Robertson is here to discuss her progress with her obesity treatment plan along with follow-up of her obesity related diagnoses. Sharon Robertson is on the Category 4 Plan and states she is following her eating plan approximately 95% of the time. Sharon Robertson states she is walking and swimming for 15-45 minutes 3-4 times per week.  Today's visit was #: 2 Starting weight: 409 lbs Starting date: 05/14/2023 Today's weight: 396 lbs Today's date: 05/27/2023 Total lbs lost to date: 13 Total lbs lost since last in-office visit: 13  Interim History: Patient feels like she is in a much better plan than she was at first appointment.  It is has been ok getting back to a more structured consistent eating routine.  She is feeling hungry 1 hour after dinner and then has a snack but is still hungry.  She is doing a small dinner at lunch time.  She has been very aware of quantity of food particularly protein for supper.  For snack calories she is doing protein bars (Atkins) and doing peanut butter and yasso bars for snack.  Occasionally she may have cheese for snacks as well. No upcoming plans for July 4th.  Only plans for the month of July is working. She has decided she wants to go somewhere but just not sure where yet.   Subjective:   1. Essential hypertension Patient's blood pressure is very well-controlled today (especially and comparison to her first appointment).  She denies chest pain, chest pressure, or headache.  2. Other hyperlipidemia Patient's recent LDL was 169, HDL 52, and triglycerides 474.  10-year ASCVD risk score 2.8%.  I discussed labs with the patient today.  3. Vitamin D deficiency Patient is not on vitamin D, and she notes fatigue.  Her recent vitamin D level was of 14.9.  I discussed labs with the patient today.  4. Prediabetes Patient is not on medications.  [facility] when she started previously.  She notes some hunger at the end  of the day after evening meal.  5. Other depression Patient is on Zoloft 50 mg daily.  She notes significant improvements in symptoms since starting her medication.  She denies suicidal or homicidal ideations.  Assessment/Plan:   1. Essential hypertension Patient will continue her current medications at the same doses; controlled.  2. Other hyperlipidemia Lifestyle changes were encouraged.  We will repeat labs in 3 months.  3. Vitamin D deficiency Patient agrees to start prescription vitamin D 50,000 IU once weekly with no refills.  - Vitamin D, Ergocalciferol, (DRISDOL) 1.25 MG (50000 UNIT) CAPS capsule; Take 1 capsule (50,000 Units total) by mouth every 7 (seven) days.  Dispense: 4 capsule; Refill: 0  4. Prediabetes Pathophysiology of insulin resistance, prediabetes, and diabetes mellitus were discussed with the patient today.  No medications at this time.  If hunger continues, we will try metformin.  5. Other depression Patient will continue Zoloft with no change in medication or dose.  6. BMI 60.0-69.9, adult (HCC)  7. Obesity with starting BMI of 60.5 Sharon Robertson is currently in the action stage of change. As such, her goal is to continue with weight loss efforts. She has agreed to the Category 4 Plan.   Exercise goals: As is.   Behavioral modification strategies: increasing lean protein intake, meal planning and cooking strategies, keeping healthy foods in the home, and planning for success.  [name] has agreed to follow-up with our clinic in 2 weeks.  She was informed of the importance of frequent follow-up visits to maximize her success with intensive lifestyle modifications for her multiple health conditions.   Objective:   Blood pressure 132/84, pulse 79, temperature 97.6 F (36.4 C), height 5\' 8"  (1.727 m), weight (!) 396 lb (179.6 kg), last menstrual period 11/22/2022, SpO2 97 %. Body mass index is 60.21 kg/m.  General: Cooperative, alert, well developed, in no acute  distress. HEENT: Conjunctivae and lids unremarkable. Cardiovascular: Regular rhythm.  Lungs: Normal work of breathing. Neurologic: No focal deficits.   Lab Results  Component Value Date   CREATININE 0.64 05/14/2023   BUN 13 05/14/2023   NA 141 05/14/2023   K 4.9 05/14/2023   CL 104 05/14/2023   CO2 23 05/14/2023   Lab Results  Component Value Date   ALT 16 05/14/2023   AST 17 05/14/2023   ALKPHOS 109 05/14/2023   BILITOT 0.4 05/14/2023   Lab Results  Component Value Date   HGBA1C 5.9 (H) 05/14/2023   HGBA1C 5.7 (H) 01/24/2021   HGBA1C 6.0 (H) 05/23/2020   HGBA1C 5.9 (H) 01/04/2020   HGBA1C 5.9 (H) 07/05/2019   Lab Results  Component Value Date   INSULIN 11.1 05/14/2023   INSULIN 8.5 01/24/2021   INSULIN 13.6 05/23/2020   Lab Results  Component Value Date   TSH 2.210 05/14/2023   Lab Results  Component Value Date   CHOL 240 (H) 05/14/2023   HDL 52 05/14/2023   LDLCALC 169 (H) 05/14/2023   TRIG 109 05/14/2023   CHOLHDL 3.9 01/24/2021   Lab Results  Component Value Date   VD25OH 14.9 (L) 05/14/2023   VD25OH 41.8 01/24/2021   VD25OH 31.9 05/23/2020   Lab Results  Component Value Date   WBC 7.6 05/14/2023   HGB 12.5 05/14/2023   HCT 39.8 05/14/2023   MCV 80 05/14/2023   PLT 461 (H) 05/14/2023   Lab Results  Component Value Date   IRON 93 02/09/2020   TIBC 363 02/09/2020   FERRITIN 42 02/09/2020   Attestation Statements:   Reviewed by clinician on day of visit: allergies, medications, problem list, medical history, surgical history, family history, social history, and previous encounter notes.   I, Burt Knack, am acting as transcriptionist for Reuben Likes, MD. I have reviewed the above documentation for accuracy and completeness, and I agree with the above. - Reuben Likes, MD

## 2023-06-05 ENCOUNTER — Other Ambulatory Visit (INDEPENDENT_AMBULATORY_CARE_PROVIDER_SITE_OTHER): Payer: Self-pay | Admitting: Family Medicine

## 2023-06-05 DIAGNOSIS — F419 Anxiety disorder, unspecified: Secondary | ICD-10-CM

## 2023-06-05 DIAGNOSIS — I10 Essential (primary) hypertension: Secondary | ICD-10-CM

## 2023-06-11 ENCOUNTER — Ambulatory Visit (INDEPENDENT_AMBULATORY_CARE_PROVIDER_SITE_OTHER): Payer: 59 | Admitting: Family Medicine

## 2023-06-11 ENCOUNTER — Ambulatory Visit (HOSPITAL_COMMUNITY): Payer: BC Managed Care – PPO | Attending: Cardiology

## 2023-06-11 ENCOUNTER — Telehealth (INDEPENDENT_AMBULATORY_CARE_PROVIDER_SITE_OTHER): Payer: BC Managed Care – PPO | Admitting: Family Medicine

## 2023-06-11 ENCOUNTER — Encounter (INDEPENDENT_AMBULATORY_CARE_PROVIDER_SITE_OTHER): Payer: Self-pay | Admitting: Family Medicine

## 2023-06-11 ENCOUNTER — Encounter (INDEPENDENT_AMBULATORY_CARE_PROVIDER_SITE_OTHER): Payer: Self-pay

## 2023-06-11 DIAGNOSIS — F419 Anxiety disorder, unspecified: Secondary | ICD-10-CM

## 2023-06-11 DIAGNOSIS — Z6841 Body Mass Index (BMI) 40.0 and over, adult: Secondary | ICD-10-CM

## 2023-06-11 DIAGNOSIS — R600 Localized edema: Secondary | ICD-10-CM

## 2023-06-11 DIAGNOSIS — E559 Vitamin D deficiency, unspecified: Secondary | ICD-10-CM | POA: Diagnosis not present

## 2023-06-11 DIAGNOSIS — E669 Obesity, unspecified: Secondary | ICD-10-CM

## 2023-06-11 DIAGNOSIS — F32A Depression, unspecified: Secondary | ICD-10-CM | POA: Diagnosis not present

## 2023-06-11 DIAGNOSIS — I10 Essential (primary) hypertension: Secondary | ICD-10-CM | POA: Diagnosis not present

## 2023-06-11 LAB — ECHOCARDIOGRAM COMPLETE
Area-P 1/2: 3.45 cm2
S' Lateral: 2.8 cm

## 2023-06-11 MED ORDER — LISINOPRIL 5 MG PO TABS
5.0000 mg | ORAL_TABLET | Freq: Every day | ORAL | 0 refills | Status: AC
Start: 2023-06-11 — End: ?

## 2023-06-11 MED ORDER — SERTRALINE HCL 50 MG PO TABS
50.0000 mg | ORAL_TABLET | Freq: Every day | ORAL | 0 refills | Status: AC
Start: 2023-06-11 — End: ?

## 2023-06-11 MED ORDER — VITAMIN D (ERGOCALCIFEROL) 1.25 MG (50000 UNIT) PO CAPS
50000.0000 [IU] | ORAL_CAPSULE | ORAL | 0 refills | Status: DC
Start: 2023-06-11 — End: 2023-07-02

## 2023-06-11 MED ORDER — CHLORTHALIDONE 25 MG PO TABS
25.0000 mg | ORAL_TABLET | Freq: Every day | ORAL | 0 refills | Status: AC
Start: 2023-06-11 — End: ?

## 2023-06-11 NOTE — Progress Notes (Signed)
TeleHealth Visit:  Due to the COVID-19 pandemic, this visit was completed with telemedicine (audio/video) technology to reduce patient and provider exposure as well as to preserve personal protective equipment.   Sharon Robertson has verbally consented to this TeleHealth visit. The patient is located at home, the provider is located at home. The participants in this visit include the listed provider and patient. The visit was conducted today via MyChart video.   Chief Complaint: OBESITY Sharon Robertson is here to discuss her progress with her obesity treatment plan along with follow-up of her obesity related diagnoses. Sharon Robertson is on the Category 4 Plan and states she is following her eating plan approximately 95% of the time. Sharon Robertson states she is walking for 20 minutes 4 times per week.  Today's visit was #: 3 Starting weight: 409 lbs Starting date: 05/14/2023  Interim History: Patient had a good couple of weeks.  She stayed local for July 4th.  She has noticed on Saturdays she is going out to eat with her dad and brother as a family get together.  She has otherwise figured out how to get all the food in throughout the day and is no longer hungry an hour after eating.  Feels satisfied after eating. Is getting in 10oz for supper. For snack calories she changed the protein bar she was eating, yasso bar and some cheese. Next few weeks she is going to take a week off work and may go rent a cabin in [place] and go to [employer].  Subjective:   1. Essential hypertension Patient has a virtual visit today.  She denies chest pain, chest pressure, or headache.  She is on 2 medications currently.  2. Vitamin D deficiency Patient denies nausea, vomiting, or muscle weakness but notes fatigue.  3. Anxiety and depression Patient is on sertraline 50 mg.  Assessment/Plan:   1. Essential hypertension Patient will continue her medications, and we will refill chlorthalidone and lisinopril for 90 days.  - chlorthalidone  (HYGROTON) 25 MG tablet; Take 1 tablet (25 mg total) by mouth daily.  Dispense: 90 tablet; Refill: 0 - lisinopril (ZESTRIL) 5 MG tablet; Take 1 tablet (5 mg total) by mouth daily.  Dispense: 90 tablet; Refill: 0  2. Vitamin D deficiency Patient will continue prescription vitamin D, and we will refill for 1 month.  - Vitamin D, Ergocalciferol, (DRISDOL) 1.25 MG (50000 UNIT) CAPS capsule; Take 1 capsule (50,000 Units total) by mouth every 7 (seven) days.  Dispense: 4 capsule; Refill: 0  3. Anxiety and depression Patient will continue sertraline 50 mg once daily, and we will refill for 90 days.  - sertraline (ZOLOFT) 50 MG tablet; Take 1 tablet (50 mg total) by mouth daily.  Dispense: 90 tablet; Refill: 0  4. BMI 60.0-69.9, adult (HCC)  5. Obesity with starting BMI of 60.5 Sharon Robertson is currently in the action stage of change. As such, her goal is to continue with weight loss efforts. She has agreed to the Category 4 Plan.   Exercise goals: No exercise has been prescribed at this time.  Behavioral modification strategies: increasing lean protein intake, meal planning and cooking strategies, keeping healthy foods in the home, and planning for success.  Sharon Robertson has agreed to follow-up with our clinic in 3 weeks. She was informed of the importance of frequent follow-up visits to maximize her success with intensive lifestyle modifications for her multiple health conditions.  Objective:   VITALS: Per patient if applicable, see vitals. GENERAL: Alert and in no acute distress. CARDIOPULMONARY:  No increased WOB. Speaking in clear sentences.  PSYCH: Pleasant and cooperative. Speech normal rate and rhythm. Affect is appropriate. Insight and judgement are appropriate. Attention is focused, linear, and appropriate.  NEURO: Oriented as arrived to appointment on time with no prompting.   Lab Results  Component Value Date   CREATININE 0.64 05/14/2023   BUN 13 05/14/2023   NA 141 05/14/2023   K 4.9  05/14/2023   CL 104 05/14/2023   CO2 23 05/14/2023   Lab Results  Component Value Date   ALT 16 05/14/2023   AST 17 05/14/2023   ALKPHOS 109 05/14/2023   BILITOT 0.4 05/14/2023   Lab Results  Component Value Date   HGBA1C 5.9 (H) 05/14/2023   HGBA1C 5.7 (H) 01/24/2021   HGBA1C 6.0 (H) 05/23/2020   HGBA1C 5.9 (H) 01/04/2020   HGBA1C 5.9 (H) 07/05/2019   Lab Results  Component Value Date   INSULIN 11.1 05/14/2023   INSULIN 8.5 01/24/2021   INSULIN 13.6 05/23/2020   Lab Results  Component Value Date   TSH 2.210 05/14/2023   Lab Results  Component Value Date   CHOL 240 (H) 05/14/2023   HDL 52 05/14/2023   LDLCALC 169 (H) 05/14/2023   TRIG 109 05/14/2023   CHOLHDL 3.9 01/24/2021   Lab Results  Component Value Date   VD25OH 14.9 (L) 05/14/2023   VD25OH 41.8 01/24/2021   VD25OH 31.9 05/23/2020   Lab Results  Component Value Date   WBC 7.6 05/14/2023   HGB 12.5 05/14/2023   HCT 39.8 05/14/2023   MCV 80 05/14/2023   PLT 461 (H) 05/14/2023   Lab Results  Component Value Date   IRON 93 02/09/2020   TIBC 363 02/09/2020   FERRITIN 42 02/09/2020    Attestation Statements:   Reviewed by clinician on day of visit: allergies, medications, problem list, medical history, surgical history, family history, social history, and previous encounter notes.   I, Burt Knack, am acting as transcriptionist for Reuben Likes, MD.  I have reviewed the above documentation for accuracy and completeness, and I agree with the above. - Reuben Likes, MD

## 2023-06-15 ENCOUNTER — Telehealth (INDEPENDENT_AMBULATORY_CARE_PROVIDER_SITE_OTHER): Payer: BC Managed Care – PPO | Admitting: Psychology

## 2023-06-15 NOTE — Progress Notes (Unsigned)
Office: [phone]  /  Fax: [phone]    Date: June 15, 2023    Appointment Start Time: *** Duration: *** minutes Provider: Lawerance Cruel, Psy.D. Type of Session: Intake for Individual Therapy  Location of Patient: {gbptloc:23249} (private location) Location of Provider: Provider's home (private office) Type of Contact: Telepsychological Visit via MyChart Video Visit  Informed Consent: This provider called Sharon Robertson at 9:03am as she did not present for today's appointment. A HIPAA compliant voicemail was left requesting a call back.  As such, today's appointment was initiated *** minutes late.Prior to proceeding with today's appointment, two pieces of identifying information were obtained. In addition, Sharon Robertson's physical location at the time of this appointment was obtained as well a phone number she could be reached at in the event of technical difficulties. Sharon Robertson and this provider participated in today's telepsychological service.   The provider's role was explained to Sharon Robertson. The provider reviewed and discussed issues of confidentiality, privacy, and limits therein (e.g., reporting obligations). In addition to verbal informed consent, written informed consent for psychological services was obtained prior to the initial appointment. Since the clinic is not a 24/7 crisis center, mental health emergency resources were shared and this  provider explained MyChart, e-mail, voicemail, and/or other messaging systems should be utilized only for non-emergency reasons. This provider also explained that information obtained during appointments will be placed in Sharon Robertson's medical record and relevant information will be shared with other providers at [facility] at any locations for coordination of care. Sharon Robertson agreed information may be shared with other [facility] providers as needed for coordination of care and by signing the service agreement document, she provided  written consent for coordination of care. Prior to initiating telepsychological services, Sharon Robertson completed an informed consent document, which included the development of a safety plan (i.e., an emergency contact and emergency resources) in the event of an emergency/crisis. Sharon Robertson verbally acknowledged understanding she is ultimately responsible for understanding her insurance benefits for telepsychological and in-person services. This provider also reviewed confidentiality, as it relates to telepsychological services. Sharon Robertson  acknowledged understanding that appointments cannot be recorded without both party consent and she is aware she is responsible for securing confidentiality on her end of the session. Sharon Robertson verbally consented to proceed.  Chief Complaint/HPI: Sharon Robertson was referred by Dr. Reuben Likes due to  anxiety and depression . Per the note for the visit with Dr. Reuben Likes on 05/14/2023, "Patient was previously on Lexapro. She denies suicidal or homicidal ideations."   During today's appointment, Sharon Robertson was verbally administered a questionnaire assessing various behaviors related to emotional eating behaviors. Sharon Robertson endorsed the following: {gbmoodandfood:21755}. She shared she craves ***. Sharon Robertson believes the onset of emotional eating behaviors was *** and described the current frequency of emotional eating behaviors as ***. In addition, Sharon Robertson {gblegal:22371} a history of binge eating behaviors. *** Currently, Sharon Robertson indicated ***. Furthermore, Sharon Robertson {gblegal:22371} other problems of concern. ***   Mental Status Examination:  Appearance: {Appearance:22431} Behavior: {Behavior:22445} Mood: {gbmood:21757} Affect: {Affect:22436} Speech: {Speech:22432} Eye Contact: {Eye Contact:22433} Psychomotor Activity: {Motor Activity:22434} Gait: unable to assess  Thought Process: {thought process:22448}  Thought Content/Perception: {disturbances:22451} Orientation:  {Orientation:22437} Memory/Concentration: {gbcognition:22449} Insight/Judgment: {Insight:22446}  Family & Psychosocial History: Andera reported she is *** and ***. She indicated she is currently ***. Additionally, Sharon Robertson shared her highest level of education obtained is ***. Currently, Sharon Robertson's social support system consists of her ***. Moreover, Sharon Robertson stated she resides with her ***.   Medical History:  Past  Medical History:  Diagnosis Date   Asthma    Back pain    Bilateral swelling of feet and ankles    Hyperlipemia    Hypertension    Joint pain    Prediabetes    SOB (shortness of breath)    Vitamin D deficiency    Past Surgical History:  Procedure Laterality Date   ADENOIDECTOMY     childhood   Current Outpatient Medications on File Prior to Visit  Medication Sig Dispense Refill   chlorthalidone (HYGROTON) 25 MG tablet Take 1 tablet (25 mg total) by mouth daily. 90 tablet 0   lisinopril (ZESTRIL) 5 MG tablet Take 1 tablet (5 mg total) by mouth daily. 90 tablet 0   sertraline (ZOLOFT) 50 MG tablet Take 1 tablet (50 mg total) by mouth daily. 90 tablet 0   Vitamin D, Ergocalciferol, (DRISDOL) 1.25 MG (50000 UNIT) CAPS capsule Take 1 capsule (50,000 Units total) by mouth every 7 (seven) days. 4 capsule 0   No current facility-administered medications on file prior to visit.    Mental Health History: Sharon Robertson reported ***. She {gblegal:22371} a history of psychotropic medications. Sharon Robertson {Endorse or deny of item:23407} hospitalizations for psychiatric concerns. Sharon Robertson {gblegal:22371} a family history of mental health/substance abuse related concerns. *** Sharon Robertson {Endorse or deny of item:23407} trauma including {gbtrauma:22071} abuse, as well as neglect. Sharon Robertson described her typical mood lately as ***. Aside from concerns noted above and endorsed on the PHQ-9 and GAD-7, Sharon Robertson reported ***. Sharon Robertson {gblegal:22371} current alcohol use. *** She {gblegal:22371} tobacco use. *** She  {gblegal:22371} illicit/recreational substance use. Furthermore, Sharon Robertson indicated she is not experiencing the following: {gbsxs:21965}. She also denied history of and current suicidal ideation, plan, and intent; history of and current homicidal ideation, plan, and intent; and history of and current engagement in self-harm.  Legal History: Sharon Robertson {Endorse or deny of item:23407} legal involvement.   Structured Assessments Results: The Patient Health Questionnaire-9 (PHQ-9) is a self-report measure that assesses symptoms and severity of depression over the course of the last two weeks. Sharon Robertson obtained a score of *** suggesting {GBPHQ9SEVERITY:21752}. Sharon Robertson finds the endorsed symptoms to be {gbphq9difficulty:21754}. [0= Not at all; 1= Several days; 2= More than half the days; 3= Nearly every day] Little interest or pleasure in doing things ***  Feeling down, depressed, or hopeless ***  Trouble falling or staying asleep, or sleeping too much ***  Feeling tired or having little energy ***  Poor appetite or overeating ***  Feeling bad about yourself --- or that you are a failure or have let yourself or your family down ***  Trouble concentrating on things, such as reading the newspaper or watching television ***  Moving or speaking so slowly that other people could have noticed? Or the opposite --- being so fidgety or restless that you have been moving around a lot more than usual ***  Thoughts that you would be better off dead or hurting yourself in some way ***  PHQ-9 Score ***    The Generalized Anxiety Disorder-7 (GAD-7) is a brief self-report measure that assesses symptoms of anxiety over the course of the last two weeks. Sharon Robertson obtained a score of *** suggesting {gbgad7severity:21753}. Sharon Robertson finds the endorsed symptoms to be {gbphq9difficulty:21754}. [0= Not at all; 1= Several days; 2= Over half the days; 3= Nearly every day] Feeling nervous, anxious, on edge ***  Not being able to stop or control  worrying ***  Worrying too much about different things ***  Trouble relaxing ***  Being so restless that it's hard to sit still ***  Becoming easily annoyed or irritable ***  Feeling afraid as if something awful might happen ***  GAD-7 Score ***   Interventions:  {Interventions List for Intake:23406}  Diagnostic Impressions & Provisional DSM-5 Diagnosis(es): Based on the aforementioned, the following diagnosis(es) were assigned: {Diagnoses:22752}.  Plan: Sharon Robertson appears able and willing to participate as evidenced by engagement in reciprocal conversation and asking questions as needed for clarification. The next appointment is scheduled for *** at ***, which will be via MyChart Video Visit. The following treatment goal was established: {gbtxgoals:21759}. This provider will regularly review the treatment plan and medical chart to keep informed of status changes. Sharon Robertson expressed understanding and agreement with the initial treatment plan of care. *** Terrah will be sent a handout via e-mail to utilize between now and the next appointment to increase awareness of hunger patterns and subsequent eating. Sharon Robertson provided verbal consent during today's appointment for this provider to send the handout via e-mail. ***

## 2023-06-17 ENCOUNTER — Other Ambulatory Visit: Payer: Self-pay | Admitting: Family Medicine

## 2023-06-17 DIAGNOSIS — Z1231 Encounter for screening mammogram for malignant neoplasm of breast: Secondary | ICD-10-CM

## 2023-07-02 ENCOUNTER — Ambulatory Visit (INDEPENDENT_AMBULATORY_CARE_PROVIDER_SITE_OTHER): Payer: BC Managed Care – PPO | Admitting: Family Medicine

## 2023-07-02 ENCOUNTER — Ambulatory Visit
Admission: RE | Admit: 2023-07-02 | Discharge: 2023-07-02 | Disposition: A | Discharge status: Home / Self Care | Payer: BC Managed Care – PPO | Source: Ambulatory Visit

## 2023-07-02 ENCOUNTER — Encounter (INDEPENDENT_AMBULATORY_CARE_PROVIDER_SITE_OTHER): Payer: Self-pay | Admitting: Family Medicine

## 2023-07-02 VITALS — BP 140/84 | HR 114 | Temp 98.3°F | Ht 68.0 in | Wt 395.0 lb

## 2023-07-02 DIAGNOSIS — E7849 Other hyperlipidemia: Secondary | ICD-10-CM | POA: Diagnosis not present

## 2023-07-02 DIAGNOSIS — E669 Obesity, unspecified: Secondary | ICD-10-CM | POA: Diagnosis not present

## 2023-07-02 DIAGNOSIS — Z1231 Encounter for screening mammogram for malignant neoplasm of breast: Secondary | ICD-10-CM | POA: Diagnosis not present

## 2023-07-02 DIAGNOSIS — E559 Vitamin D deficiency, unspecified: Secondary | ICD-10-CM

## 2023-07-02 DIAGNOSIS — Z6841 Body Mass Index (BMI) 40.0 and over, adult: Secondary | ICD-10-CM | POA: Diagnosis not present

## 2023-07-02 MED ORDER — VITAMIN D (ERGOCALCIFEROL) 1.25 MG (50000 UNIT) PO CAPS
50000.0000 [IU] | ORAL_CAPSULE | ORAL | 0 refills | Status: DC
Start: 2023-07-02 — End: 2023-07-23

## 2023-07-02 NOTE — Progress Notes (Signed)
Chief Complaint:   OBESITY Sharon Robertson is here to discuss her progress with her obesity treatment plan along with follow-up of her obesity related diagnoses. [name] is on the Category 4 Plan and states she is following her eating plan approximately 95% of the time. [name] states she is walking and swimming for 20-45 minutes 2-4 times per week.  Today's visit was #: 4 Starting weight: 409 lbs Starting date: 05/14/2023 Today's weight: 395 lbs Today's date: 07/02/2023 Total lbs lost to date: 14 Total lbs lost since last in-office visit: 1  Interim History: Since last appointment she has mostly been working- she is taking a week off at the end of August and feels she just needs a break. She is still doing eggs in the am, eating a protein bar as a snack at work and is trying to get most of her protein in at lunch.  She has been more consistent about getting all protein in.  She isn't drinking as much water as she needs to.  She now feels confident that she is getting all the food in.  She is eating even if she isn't hungry.   Subjective:   1. Vitamin D deficiency Patient is on prescription Vitamin D. She denies nausea, vomiting, or muscle weakness but notes fatigue.   2. Other hyperlipidemia Patient's last LDL was 169, HDL 52, and triglycerides 536. She is not on medications.   Assessment/Plan:   1. Vitamin D deficiency Patient will continue prescription Vitamin D, and we will refill for 1 month.   - Vitamin D, Ergocalciferol, (DRISDOL) 1.25 MG (50000 UNIT) CAPS capsule; Take 1 capsule (50,000 Units total) by mouth every 7 (seven) days.  Dispense: 4 capsule; Refill: 0  2. Other hyperlipidemia We will repeat labs in October.   3. BMI 60.0-69.9, adult (HCC)  4. Obesity with starting BMI of 60.5 Sharon Robertson is currently in the action stage of change. As such, her goal is to continue with weight loss efforts. She has agreed to the Category 4 Plan.   Exercise goals: As is.   Behavioral  modification strategies: increasing lean protein intake, meal planning and cooking strategies, keeping healthy foods in the home, and planning for success.  Sharon Robertson has agreed to follow-up with our clinic in 3 weeks. She was informed of the importance of frequent follow-up visits to maximize her success with intensive lifestyle modifications for her multiple health conditions.   Objective:   Blood pressure (!) 140/84, pulse (!) 114, temperature 98.3 F (36.8 C), height 5\' 8"  (1.727 m), weight (!) 395 lb (179.2 kg), last menstrual period 11/22/2022, SpO2 96%. Body mass index is 60.06 kg/m.  General: Cooperative, alert, well developed, in no acute distress. HEENT: Conjunctivae and lids unremarkable. Cardiovascular: Regular rhythm.  Lungs: Normal work of breathing. Neurologic: No focal deficits.   Lab Results  Component Value Date   CREATININE 0.64 05/14/2023   BUN 13 05/14/2023   NA 141 05/14/2023   K 4.9 05/14/2023   CL 104 05/14/2023   CO2 23 05/14/2023   Lab Results  Component Value Date   ALT 16 05/14/2023   AST 17 05/14/2023   ALKPHOS 109 05/14/2023   BILITOT 0.4 05/14/2023   Lab Results  Component Value Date   HGBA1C 5.9 (H) 05/14/2023   HGBA1C 5.7 (H) 01/24/2021   HGBA1C 6.0 (H) 05/23/2020   HGBA1C 5.9 (H) 01/04/2020   HGBA1C 5.9 (H) 07/05/2019   Lab Results  Component Value Date   INSULIN 11.1 05/14/2023  INSULIN 8.5 01/24/2021   INSULIN 13.6 05/23/2020   Lab Results  Component Value Date   TSH 2.210 05/14/2023   Lab Results  Component Value Date   CHOL 240 (H) 05/14/2023   HDL 52 05/14/2023   LDLCALC 169 (H) 05/14/2023   TRIG 109 05/14/2023   CHOLHDL 3.9 01/24/2021   Lab Results  Component Value Date   VD25OH 14.9 (L) 05/14/2023   VD25OH 41.8 01/24/2021   VD25OH 31.9 05/23/2020   Lab Results  Component Value Date   WBC 7.6 05/14/2023   HGB 12.5 05/14/2023   HCT 39.8 05/14/2023   MCV 80 05/14/2023   PLT 461 (H) 05/14/2023   Lab Results   Component Value Date   IRON 93 02/09/2020   TIBC 363 02/09/2020   FERRITIN 42 02/09/2020   Attestation Statements:   Reviewed by clinician on day of visit: allergies, medications, problem list, medical history, surgical history, family history, social history, and previous encounter notes.   I, Burt Knack, am acting as transcriptionist for Reuben Likes, MD.  I have reviewed the above documentation for accuracy and completeness, and I agree with the above. - Reuben Likes, MD

## 2023-07-23 ENCOUNTER — Ambulatory Visit (INDEPENDENT_AMBULATORY_CARE_PROVIDER_SITE_OTHER): Payer: BC Managed Care – PPO | Admitting: Family Medicine

## 2023-07-23 ENCOUNTER — Encounter (INDEPENDENT_AMBULATORY_CARE_PROVIDER_SITE_OTHER): Payer: Self-pay | Admitting: Family Medicine

## 2023-07-23 VITALS — BP 125/82 | HR 92 | Temp 97.8°F | Ht 68.0 in | Wt 393.0 lb

## 2023-07-23 DIAGNOSIS — Z6841 Body Mass Index (BMI) 40.0 and over, adult: Secondary | ICD-10-CM | POA: Diagnosis not present

## 2023-07-23 DIAGNOSIS — I10 Essential (primary) hypertension: Secondary | ICD-10-CM

## 2023-07-23 DIAGNOSIS — E669 Obesity, unspecified: Secondary | ICD-10-CM

## 2023-07-23 DIAGNOSIS — E559 Vitamin D deficiency, unspecified: Secondary | ICD-10-CM

## 2023-07-23 MED ORDER — VITAMIN D (ERGOCALCIFEROL) 1.25 MG (50000 UNIT) PO CAPS
50000.0000 [IU] | ORAL_CAPSULE | ORAL | 0 refills | Status: AC
Start: 2023-07-23 — End: ?

## 2023-07-23 NOTE — Progress Notes (Signed)
Chief Complaint:   OBESITY Sharon Robertson is here to discuss her progress with her obesity treatment plan along with follow-up of her obesity related diagnoses. Sharon Robertson is on the Category 4 Plan and states she is following her eating plan approximately 95% of the time. Sharon Robertson states she is walking, swimming, and doing sit-ups for 15-60 minutes 2-7 times per week.  Today's visit was #: 5 Starting weight: 409 lbs Starting date: 05/14/2023 Today's weight: 393 lbs Today's date: 07/23/2023 Total lbs lost to date: 16 Total lbs lost since last in-office visit: 2  Interim History: Patient is feeling better and having more energy.  She still notices struggles with lunch at work because patient's are back to back.  She is normally not able to get all food in during those days.  Sometimes she is using atkins protein shake.  Thinks perhaps she needs to plan for days that she may be there later.  She has tried to adjust her schedule so she recognizes she will just have to figure out what to do food wise. She is off next week and thinks she may go to [facility] at the end of next week. She is pushing herself to do more and is getting better at going to the grocery store.   Subjective:   1. Vitamin D deficiency Patient is on prescription vitamin D.  She denies nausea, vomiting, or muscle weakness but notes fatigue.  2. Essential hypertension Patient's blood pressure is well-controlled today.  She denies chest pain, chest pressure, or headache.  Assessment/Plan:   1. Vitamin D deficiency Patient will continue prescription vitamin D once weekly, and we will refill for 1 month.  - Vitamin D, Ergocalciferol, (DRISDOL) 1.25 MG (50000 UNIT) CAPS capsule; Take 1 capsule (50,000 Units total) by mouth every 7 (seven) days.  Dispense: 4 capsule; Refill: 0  2. Essential hypertension Patient will continue chlorthalidone and lisinopril at her same dose.  3. BMI 50.0-59.9, adult (HCC)  4. Obesity with starting BMI of  60.5 Sharon Robertson is currently in the action stage of change. As such, her goal is to continue with weight loss efforts. She has agreed to the Category 4 Plan.   Exercise goals: All adults should avoid inactivity. Some physical activity is better than none, and adults who participate in any amount of physical activity gain some health benefits.  Behavioral modification strategies: increasing lean protein intake, meal planning and cooking strategies, keeping healthy foods in the home, and planning for success.  Sharon Robertson has agreed to follow-up with our clinic in 5 weeks. She was informed of the importance of frequent follow-up visits to maximize her success with intensive lifestyle modifications for her multiple health conditions.   Objective:   Blood pressure 125/82, pulse 92, temperature 97.8 F (36.6 C), height 5\' 8"  (1.727 m), weight (!) 393 lb (178.3 kg), last menstrual period 11/22/2022, SpO2 97%. Body mass index is 59.76 kg/m.  General: Cooperative, alert, well developed, in no acute distress. HEENT: Conjunctivae and lids unremarkable. Cardiovascular: Regular rhythm.  Lungs: Normal work of breathing. Neurologic: No focal deficits.   Lab Results  Component Value Date   CREATININE 0.64 05/14/2023   BUN 13 05/14/2023   NA 141 05/14/2023   K 4.9 05/14/2023   CL 104 05/14/2023   CO2 23 05/14/2023   Lab Results  Component Value Date   ALT 16 05/14/2023   AST 17 05/14/2023   ALKPHOS 109 05/14/2023   BILITOT 0.4 05/14/2023   Lab Results  Component  Value Date   HGBA1C 5.9 (H) 05/14/2023   HGBA1C 5.7 (H) 01/24/2021   HGBA1C 6.0 (H) 05/23/2020   HGBA1C 5.9 (H) 01/04/2020   HGBA1C 5.9 (H) 07/05/2019   Lab Results  Component Value Date   INSULIN 11.1 05/14/2023   INSULIN 8.5 01/24/2021   INSULIN 13.6 05/23/2020   Lab Results  Component Value Date   TSH 2.210 05/14/2023   Lab Results  Component Value Date   CHOL 240 (H) 05/14/2023   HDL 52 05/14/2023   LDLCALC 169 (H)  05/14/2023   TRIG 109 05/14/2023   CHOLHDL 3.9 01/24/2021   Lab Results  Component Value Date   VD25OH 14.9 (L) 05/14/2023   VD25OH 41.8 01/24/2021   VD25OH 31.9 05/23/2020   Lab Results  Component Value Date   WBC 7.6 05/14/2023   HGB 12.5 05/14/2023   HCT 39.8 05/14/2023   MCV 80 05/14/2023   PLT 461 (H) 05/14/2023   Lab Results  Component Value Date   IRON 93 02/09/2020   TIBC 363 02/09/2020   FERRITIN 42 02/09/2020   Attestation Statements:   Reviewed by clinician on day of visit: allergies, medications, problem list, medical history, surgical history, family history, social history, and previous encounter notes.   I, Burt Knack, am acting as transcriptionist for Reuben Likes, MD.  I have reviewed the above documentation for accuracy and completeness, and I agree with the above. - Reuben Likes, MD

## 2023-09-03 ENCOUNTER — Encounter (INDEPENDENT_AMBULATORY_CARE_PROVIDER_SITE_OTHER): Payer: Self-pay | Admitting: Family Medicine

## 2023-09-03 ENCOUNTER — Ambulatory Visit (INDEPENDENT_AMBULATORY_CARE_PROVIDER_SITE_OTHER): Payer: BC Managed Care – PPO | Admitting: Family Medicine

## 2023-09-03 VITALS — BP 142/84 | HR 89 | Temp 97.7°F | Ht 68.0 in | Wt 398.0 lb

## 2023-09-03 DIAGNOSIS — F419 Anxiety disorder, unspecified: Secondary | ICD-10-CM | POA: Diagnosis not present

## 2023-09-03 DIAGNOSIS — Z6841 Body Mass Index (BMI) 40.0 and over, adult: Secondary | ICD-10-CM

## 2023-09-03 DIAGNOSIS — E559 Vitamin D deficiency, unspecified: Secondary | ICD-10-CM | POA: Diagnosis not present

## 2023-09-03 DIAGNOSIS — F32A Depression, unspecified: Secondary | ICD-10-CM | POA: Diagnosis not present

## 2023-09-03 DIAGNOSIS — E669 Obesity, unspecified: Secondary | ICD-10-CM

## 2023-09-03 DIAGNOSIS — I10 Essential (primary) hypertension: Secondary | ICD-10-CM

## 2023-09-03 MED ORDER — VITAMIN D (ERGOCALCIFEROL) 1.25 MG (50000 UNIT) PO CAPS
50000.0000 [IU] | ORAL_CAPSULE | ORAL | 0 refills | Status: DC
Start: 2023-09-03 — End: 2023-10-01

## 2023-09-03 MED ORDER — CHLORTHALIDONE 25 MG PO TABS
25.0000 mg | ORAL_TABLET | Freq: Every day | ORAL | 0 refills | Status: DC
Start: 2023-09-03 — End: 2023-10-01

## 2023-09-03 MED ORDER — LISINOPRIL 5 MG PO TABS
5.0000 mg | ORAL_TABLET | Freq: Every day | ORAL | 0 refills | Status: DC
Start: 2023-09-03 — End: 2023-10-01

## 2023-09-03 MED ORDER — SERTRALINE HCL 50 MG PO TABS
50.0000 mg | ORAL_TABLET | Freq: Every day | ORAL | 0 refills | Status: DC
Start: 2023-09-03 — End: 2023-10-01

## 2023-09-03 NOTE — Progress Notes (Signed)
Chief Complaint:   OBESITY Sharon Robertson is here to discuss her progress with her obesity treatment plan along with follow-up of her obesity related diagnoses. Sharon Robertson is on the Category 4 Plan and states she is following her eating plan approximately 95% of the time. Sharon Robertson states she is walking for 30 minutes 4 times per week.  Today's visit was #: 6 Starting weight: 409 lbs Starting date: 05/14/2023 Today's weight: 398 lbs Today's date: 09/03/2023 Total lbs lost to date: 11 Total lbs lost since last in-office visit: 0  Interim History: Patient had a good last few weeks. Since last appointment she went to the beach for a few days.  Otherwise she has been mostly working. She has been more conscious with getting all the protein she needs in for the day.  She is normally around 130g of protein a day. She is using combination of zero sugar yogurt, chicken, protein shake.  She is drinking more water. She has been walking more stairs daily. She is getting all the food in consistently.  She isn't hungry and she finally figured out her schedule to get her meals and her snacks in.   Subjective:   1. Essential hypertension Patient is on chlorthalidone and lisinopril.  She denies chest pain, chest pressure, or headache.  2. Vitamin D deficiency Patient denies nausea, vomiting, or muscle weakness but notes fatigue.  Her last vitamin D level was of 14.9.  3. Anxiety and depression Patient denies suicidal or homicidal ideations.  Her symptoms are better controlled, but she is experiencing increase in work stress.  Assessment/Plan:   1. Essential hypertension We will refill lisinopril 5 mg once daily and chlorthalidone 25 mg once daily for 90 days.  - chlorthalidone (HYGROTON) 25 MG tablet; Take 1 tablet (25 mg total) by mouth daily.  Dispense: 90 tablet; Refill: 0 - lisinopril (ZESTRIL) 5 MG tablet; Take 1 tablet (5 mg total) by mouth daily.  Dispense: 90 tablet; Refill: 0  2. Vitamin D  deficiency We will refill vitamin D 50,000 IU once weekly for 1 month.  - Vitamin D, Ergocalciferol, (DRISDOL) 1.25 MG (50000 UNIT) CAPS capsule; Take 1 capsule (50,000 Units total) by mouth every 7 (seven) days.  Dispense: 4 capsule; Refill: 0  3. Anxiety and depression We will refill sertraline 50 mg once daily for 90 days.  - sertraline (ZOLOFT) 50 MG tablet; Take 1 tablet (50 mg total) by mouth daily.  Dispense: 90 tablet; Refill: 0  4. BMI 60.0-69.9, adult (HCC)  5. Obesity with starting BMI of 60.5 Sharon Robertson is currently in the action stage of change. As such, her goal is to continue with weight loss efforts. She has agreed to the Category 4 Plan.   Exercise goals: All adults should avoid inactivity. Some physical activity is better than none, and adults who participate in any amount of physical activity gain some health benefits.  Behavioral modification strategies: increasing lean protein intake, meal planning and cooking strategies, keeping healthy foods in the home, and planning for success.  Sharon Robertson has agreed to follow-up with our clinic in 3 weeks. She was informed of the importance of frequent follow-up visits to maximize her success with intensive lifestyle modifications for her multiple health conditions.   Objective:   Blood pressure (!) 142/84, pulse 89, temperature 97.7 F (36.5 C), height 5\' 8"  (1.727 m), weight (!) 398 lb (180.5 kg), last menstrual period 11/22/2022, SpO2 96%. Body mass index is 60.52 kg/m.  General: Cooperative, alert, well developed,  in no acute distress. HEENT: Conjunctivae and lids unremarkable. Cardiovascular: Regular rhythm.  Lungs: Normal work of breathing. Neurologic: No focal deficits.   Lab Results  Component Value Date   CREATININE 0.64 05/14/2023   BUN 13 05/14/2023   NA 141 05/14/2023   K 4.9 05/14/2023   CL 104 05/14/2023   CO2 23 05/14/2023   Lab Results  Component Value Date   ALT 16 05/14/2023   AST 17 05/14/2023    ALKPHOS 109 05/14/2023   BILITOT 0.4 05/14/2023   Lab Results  Component Value Date   HGBA1C 5.9 (H) 05/14/2023   HGBA1C 5.7 (H) 01/24/2021   HGBA1C 6.0 (H) 05/23/2020   HGBA1C 5.9 (H) 01/04/2020   HGBA1C 5.9 (H) 07/05/2019   Lab Results  Component Value Date   INSULIN 11.1 05/14/2023   INSULIN 8.5 01/24/2021   INSULIN 13.6 05/23/2020   Lab Results  Component Value Date   TSH 2.210 05/14/2023   Lab Results  Component Value Date   CHOL 240 (H) 05/14/2023   HDL 52 05/14/2023   LDLCALC 169 (H) 05/14/2023   TRIG 109 05/14/2023   CHOLHDL 3.9 01/24/2021   Lab Results  Component Value Date   VD25OH 14.9 (L) 05/14/2023   VD25OH 41.8 01/24/2021   VD25OH 31.9 05/23/2020   Lab Results  Component Value Date   WBC 7.6 05/14/2023   HGB 12.5 05/14/2023   HCT 39.8 05/14/2023   MCV 80 05/14/2023   PLT 461 (H) 05/14/2023   Lab Results  Component Value Date   IRON 93 02/09/2020   TIBC 363 02/09/2020   FERRITIN 42 02/09/2020   Attestation Statements:   Reviewed by clinician on day of visit: allergies, medications, problem list, medical history, surgical history, family history, social history, and previous encounter notes.   I, Burt Knack, am acting as transcriptionist for Reuben Likes, MD.  I have reviewed the above documentation for accuracy and completeness, and I agree with the above. - Reuben Likes, MD

## 2023-10-01 ENCOUNTER — Ambulatory Visit (INDEPENDENT_AMBULATORY_CARE_PROVIDER_SITE_OTHER): Payer: BC Managed Care – PPO | Admitting: Family Medicine

## 2023-10-01 ENCOUNTER — Encounter (INDEPENDENT_AMBULATORY_CARE_PROVIDER_SITE_OTHER): Payer: Self-pay | Admitting: Family Medicine

## 2023-10-01 VITALS — BP 129/82 | HR 93 | Temp 97.8°F | Ht 68.0 in | Wt 396.0 lb

## 2023-10-01 DIAGNOSIS — Z6841 Body Mass Index (BMI) 40.0 and over, adult: Secondary | ICD-10-CM

## 2023-10-01 DIAGNOSIS — E559 Vitamin D deficiency, unspecified: Secondary | ICD-10-CM

## 2023-10-01 DIAGNOSIS — F419 Anxiety disorder, unspecified: Secondary | ICD-10-CM

## 2023-10-01 DIAGNOSIS — N950 Postmenopausal bleeding: Secondary | ICD-10-CM

## 2023-10-01 DIAGNOSIS — I10 Essential (primary) hypertension: Secondary | ICD-10-CM

## 2023-10-01 DIAGNOSIS — F32A Depression, unspecified: Secondary | ICD-10-CM | POA: Diagnosis not present

## 2023-10-01 DIAGNOSIS — E669 Obesity, unspecified: Secondary | ICD-10-CM

## 2023-10-01 MED ORDER — CHLORTHALIDONE 25 MG PO TABS
25.0000 mg | ORAL_TABLET | Freq: Every day | ORAL | 0 refills | Status: DC
Start: 2023-10-01 — End: 2023-11-05

## 2023-10-01 MED ORDER — SERTRALINE HCL 50 MG PO TABS
50.0000 mg | ORAL_TABLET | Freq: Every day | ORAL | 0 refills | Status: DC
Start: 2023-10-01 — End: 2023-11-05

## 2023-10-01 MED ORDER — LISINOPRIL 5 MG PO TABS
5.0000 mg | ORAL_TABLET | Freq: Every day | ORAL | 0 refills | Status: DC
Start: 2023-10-01 — End: 2023-11-05

## 2023-10-01 MED ORDER — VITAMIN D (ERGOCALCIFEROL) 1.25 MG (50000 UNIT) PO CAPS
50000.0000 [IU] | ORAL_CAPSULE | ORAL | 0 refills | Status: DC
Start: 2023-10-01 — End: 2023-11-05

## 2023-10-01 NOTE — Assessment & Plan Note (Addendum)
Patient not on any hormonal medication.  She is working on weight loss.  She previously had not had a period in 2 years and just experienced a period that lasted about 4 days.  She does not have a gyn that she sees regularly anymore. Information given for Dr. Charlotta Newton at [facility] in [place] to establish care.

## 2023-10-01 NOTE — Assessment & Plan Note (Signed)
Patient is on zoloft and is having good stabilization of the symptoms she was experiencing.  She has no felt any suicidal or homicidal ideation.  She feels well managed and needs a refill today.

## 2023-10-01 NOTE — Assessment & Plan Note (Signed)
On both lisinopril and chlorthalidone.  Blood pressure is at goal today.  She denies any side effects of medication as well as any feelings of chest pain, chest pressure, headache.  She needs refills of both medications today.

## 2023-10-01 NOTE — Progress Notes (Signed)
[facility] [address] [place] [address] Dept: [phone] Dept Fax: [phone]  AT A GLANCE:  Vitals Temp: 97.8 F (36.6 C) BP: 129/82 Pulse Rate: 93 SpO2: 97 %   Anthropometric Measurements Height: 5\' 8"  (1.727 m) Weight: (!) 396 lb (179.6 kg) BMI (Calculated): 60.23 Weight at Last Visit: 398 lb Weight Lost Since Last Visit: 2 Weight Gained Since Last Visit: 0 Starting Weight: 410 lb Total Weight Loss (lbs): 14 lb (6.35 kg)   Body Composition  Body Fat %: 62.3 % Fat Mass (lbs): 247.2 lbs Muscle Mass (lbs): 142 lbs Visceral Fat Rating : 27   Other Clinical Data Today's Visit #: 29 lbs Starting Date: 02/09/20     SUBJECTIVE: Follow up; Getting more consistent nutrition in   [facility] is here to discuss her progress with her obesity treatment plan. She is on the Category 4 Plan and states she is following her eating plan approximately 98 % of the time. She states she is exercising and walking half a mile or 30 minutes 2-4 times a week.  Since last appointment she has been working, celebrated her great nieces 52 year old birthday and went and saw an [employer] (consulting) concert.  She has been much better at making time in her schedule to consistently eat.  She has been trying to eat her biggest meal at lunch as she knows she will eat and isn't always sure when she will be able to eat dinner.  She still enjoys the [employer]. Sundays she is making a meal to take for lunch for a few days.  For the next few weeks she is anticipating going to her nephews house for Thanksgiving.    OBJECTIVE: Visit Diagnoses: Problem List Items Addressed This Visit       Cardiovascular and Mediastinum   Essential hypertension   Relevant Medications   chlorthalidone (HYGROTON) 25 MG tablet   lisinopril (ZESTRIL) 5 MG tablet     Other   Vitamin D deficiency   Relevant Medications   Vitamin D,  Ergocalciferol, (DRISDOL) 1.25 MG (50000 UNIT) CAPS capsule   Other Visit Diagnoses     Post-menopausal bleeding    -  Primary   Anxiety and depression       Relevant Medications   sertraline (ZOLOFT) 50 MG tablet          ASSESSMENT AND PLAN: Problem List Items Addressed This Visit       Cardiovascular and Mediastinum   Essential hypertension    On both lisinopril and chlorthalidone.  Blood pressure is at goal today.  She denies any side effects of medication as well as any feelings of chest pain, chest pressure, headache.  She needs refills of both medications today.      Relevant Medications   chlorthalidone (HYGROTON) 25 MG tablet   lisinopril (ZESTRIL) 5 MG tablet     Other   Vitamin D deficiency    On Rx Vitamin D. Discussed importance of vitamin d supplementation.  Vitamin d supplementation has been shown to decrease fatigue, decrease risk of progression to insulin resistance and then prediabetes, decreases risk of falling in older age and can even assist in decreasing depressive symptoms in PTSD.   Prescription for Vitamin D sent in.  She is not at goal yet.        Relevant Medications   Vitamin D, Ergocalciferol, (DRISDOL) 1.25 MG (50000 UNIT) CAPS capsule   Post-menopausal  bleeding - Primary    Patient not on any hormonal medication.  She is working on weight loss.  She previously had not had a period in 2 years and just experienced a period that lasted about 4 days.  She does not have a gyn that she sees regularly anymore. Information given for Dr. Charlotta Newton at [facility] in [place] to establish care.      Anxiety and depression    Patient is on zoloft and is having good stabilization of the symptoms she was experiencing.  She has no felt any suicidal or homicidal ideation.  She feels well managed and needs a refill today.      Relevant Medications   sertraline (ZOLOFT) 50 MG tablet    Diet: [name] is currently in the action stage of change. As such, her goal  is to continue with weight loss efforts. She has agreed to Category 4 Plan.  She wants to continue increase her total protein intake and not miss out on nutrition daily.  She also wants to continue to drink water with goal of 1L a day.   Exercise: [name] has been instructed to work up to a goal of 150 minutes of combined cardio and strengthening exercise per week for weight loss and overall health benefits.   Behavior Modification:  We discussed the following Behavioral Modification Strategies today: increasing lean protein intake, increasing vegetables, increase H2O intake, and no skipping meals. We discussed various medication options to help [name] with her weight loss efforts and we both agreed to continue with meal plan and revisit medications.  No follow-ups on file.Marland Kitchen She was informed of the importance of frequent follow up visits to maximize her success with intensive lifestyle modifications for her multiple health conditions.  Attestation Statements:   Reviewed by clinician on day of visit: allergies, medications, problem list, medical history, surgical history, family history, social history, and previous encounter notes.   Time spent on visit including pre-visit chart review and post-visit care and charting was 30 minutes.    Rudean Hitt, MD

## 2023-10-01 NOTE — Assessment & Plan Note (Signed)
On Rx Vitamin D. Discussed importance of vitamin d supplementation.  Vitamin d supplementation has been shown to decrease fatigue, decrease risk of progression to insulin resistance and then prediabetes, decreases risk of falling in older age and can even assist in decreasing depressive symptoms in PTSD.   Prescription for Vitamin D sent in.  She is not at goal yet.

## 2023-11-05 ENCOUNTER — Encounter (INDEPENDENT_AMBULATORY_CARE_PROVIDER_SITE_OTHER): Payer: Self-pay | Admitting: Family Medicine

## 2023-11-05 ENCOUNTER — Ambulatory Visit (INDEPENDENT_AMBULATORY_CARE_PROVIDER_SITE_OTHER): Payer: BC Managed Care – PPO | Admitting: Family Medicine

## 2023-11-05 VITALS — BP 145/78 | HR 97 | Temp 98.1°F | Ht 68.0 in | Wt >= 6400 oz

## 2023-11-05 DIAGNOSIS — E7849 Other hyperlipidemia: Secondary | ICD-10-CM | POA: Diagnosis not present

## 2023-11-05 DIAGNOSIS — Z6841 Body Mass Index (BMI) 40.0 and over, adult: Secondary | ICD-10-CM

## 2023-11-05 DIAGNOSIS — E559 Vitamin D deficiency, unspecified: Secondary | ICD-10-CM

## 2023-11-05 DIAGNOSIS — F32A Depression, unspecified: Secondary | ICD-10-CM

## 2023-11-05 DIAGNOSIS — I10 Essential (primary) hypertension: Secondary | ICD-10-CM

## 2023-11-05 DIAGNOSIS — R7303 Prediabetes: Secondary | ICD-10-CM | POA: Diagnosis not present

## 2023-11-05 DIAGNOSIS — F419 Anxiety disorder, unspecified: Secondary | ICD-10-CM

## 2023-11-05 MED ORDER — VITAMIN D (ERGOCALCIFEROL) 1.25 MG (50000 UNIT) PO CAPS
50000.0000 [IU] | ORAL_CAPSULE | ORAL | 0 refills | Status: DC
Start: 2023-11-05 — End: 2023-12-17

## 2023-11-05 MED ORDER — CHLORTHALIDONE 25 MG PO TABS
25.0000 mg | ORAL_TABLET | Freq: Every day | ORAL | 0 refills | Status: DC
Start: 2023-11-05 — End: 2024-02-11

## 2023-11-05 MED ORDER — LISINOPRIL 5 MG PO TABS
5.0000 mg | ORAL_TABLET | Freq: Every day | ORAL | 0 refills | Status: DC
Start: 2023-11-05 — End: 2024-02-11

## 2023-11-05 MED ORDER — SERTRALINE HCL 50 MG PO TABS
50.0000 mg | ORAL_TABLET | Freq: Every day | ORAL | 0 refills | Status: DC
Start: 2023-11-05 — End: 2024-02-11

## 2023-11-05 NOTE — Assessment & Plan Note (Signed)
Last labs done in June.  Patient on crestor and does not need a refill today.  FLP ordered today- will discuss at next appointment.  The 10-year ASCVD risk score (Arnett DK, et al., 2019) is: 3.4%   Values used to calculate the score:     Age: 52 years     Sex: Female     Is Non-Hispanic African American: No     Diabetic: No     Tobacco smoker: No     Systolic Blood Pressure: 145 mmHg     Is BP treated: Yes     HDL Cholesterol: 52 mg/dL     Total Cholesterol: 240 mg/dL

## 2023-11-05 NOTE — Assessment & Plan Note (Signed)
Patient feeling stressed and burned out from work.  She needs refill of her sertraline today.  She denies any SI or HI.  She is working on boundaries at work and has been able to get a schedule change that will give her some time to chart.  She has self care vacation in January.

## 2023-11-05 NOTE — Assessment & Plan Note (Addendum)
Blood pressure slightly elevated today.  She needs a refill of chlorthalidone and lisinopril.  She denies chest pain, chest pressure and headache.  Refills sent in.

## 2023-11-05 NOTE — Progress Notes (Signed)
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  She is feeling a bit overwhelmed and burnt out. She just has to get thru to January as she has time off coming up.  She has had to change her schedule to get some administration time in. She has to focus on increasing her total water intake and get back to getting more consistent nutrition in.  She feels like she wants to get back into preparing food in advance.   Candies is here to discuss her progress with her obesity treatment plan. She is on the Category 4 Plan and states she is following her eating plan approximately 90 % of the time. She states she is walking 30 minutes 4 times per week.   OBJECTIVE: Visit Diagnoses: Problem List Items Addressed This Visit       Cardiovascular and Mediastinum   Essential hypertension - Primary    Blood pressure slightly elevated today.  She needs a refill of chlorthalidone and lisinopril.  She denies chest pain, chest pressure and headache.  Refills sent in.       Relevant Medications   chlorthalidone (HYGROTON) 25 MG tablet   lisinopril (ZESTRIL) 5 MG tablet     Other   Morbid obesity (HCC)   Vitamin D deficiency    Patient on OTC vitamin d.  She does have fatigue but work has been more demanding recently.  Vit D level ordered today.      Relevant Medications   Vitamin D, Ergocalciferol, (DRISDOL) 1.25 MG (50000 UNIT) CAPS capsule   Other Relevant Orders   VITAMIN D 25 Hydroxy (Vit-D Deficiency, Fractures)   Prediabetes    Last A1c of 5.9.  On Metformin daily with no GI side effects.  Not as much control in carb intake as she would like but still some.  She has been increasingly  stressed at work and her time at home for meal prep has been limited. She has this weekend completely at home to get her meal prep back on track.  Needs repeat labs done- CMP, A1c and Insulin ordered today.      Relevant Orders    Comprehensive metabolic panel   Hemoglobin A1c   Insulin, random   Other hyperlipidemia    Last labs done in June.  Patient on crestor and does not need a refill today.  FLP ordered today- will discuss at next appointment.  The 10-year ASCVD risk score (Arnett DK, et al., 2019) is: 3.4%   Values used to calculate the score:     Age: 79 years     Sex: Female     Is Non-Hispanic African American: No     Diabetic: No     Tobacco smoker: No     Systolic Blood Pressure: 145 mmHg     Is BP treated: Yes     HDL Cholesterol: 52 mg/dL     Total Cholesterol: 240 mg/dL       Relevant Medications   chlorthalidone (HYGROTON) 25 MG tablet   lisinopril (ZESTRIL) 5 MG tablet   Other Relevant Orders   Lipid Panel With LDL/HDL Ratio   Anxiety and depression    Patient feeling stressed and burned out from work.  She needs refill of her sertraline today.  She denies any SI or HI.  She is working on boundaries at work and has been able to get a schedule change that will give her some  time to chart.  She has self care vacation in January.      Relevant Medications   sertraline (ZOLOFT) 50 MG tablet   Other Visit Diagnoses     BMI 60.0-69.9, adult (HCC)           Vitals Temp: 98.1 F (36.7 C) BP: (!) 145/78 Pulse Rate: 97 SpO2: 99 %   Anthropometric Measurements Height: 5\' 8"  (1.727 m) Weight: (!) 403 lb (182.8 kg) BMI (Calculated): 61.29 Weight at Last Visit: 396 lb Weight Lost Since Last Visit: 0 Weight Gained Since Last Visit: 7 lb Starting Weight: 410 lb Total Weight Loss (lbs): 7 lb (3.175 kg)   Body Composition  Body Fat %: 64.6 % Fat Mass (lbs): 260.4 lbs Muscle Mass (lbs): 135.4 lbs Visceral Fat Rating : 29   Other Clinical Data Today's Visit #: 30 Starting Date: 02/09/20     ASSESSMENT AND PLAN:  Diet: Ryann is currently in the action stage of change. As such, her goal is to continue with weight loss efforts. She has agreed to Category 4  Plan.  Exercise: Sakyra has been instructed that some exercise is better than none for weight loss and overall health benefits.   Behavior Modification:  We discussed the following Behavioral Modification Strategies today: increasing lean protein intake, increasing vegetables, meal planning and cooking strategies, better snacking choices, and planning for success.   No follow-ups on file.Marland Kitchen She was informed of the importance of frequent follow up visits to maximize her success with intensive lifestyle modifications for her multiple health conditions.  Attestation Statements:   Reviewed by clinician on day of visit: allergies, medications, problem list, medical history, surgical history, family history, social history, and previous encounter notes.    Reuben Likes, MD

## 2023-11-05 NOTE — Assessment & Plan Note (Signed)
Patient on OTC vitamin d.  She does have fatigue but work has been more demanding recently.  Vit D level ordered today.

## 2023-11-05 NOTE — Assessment & Plan Note (Addendum)
Last A1c of 5.9.  On Metformin daily with no GI side effects.  Not as much control in carb intake as she would like but still some.  She has been increasingly  stressed at work and her time at home for meal prep has been limited. She has this weekend completely at home to get her meal prep back on track.  Needs repeat labs done- CMP, A1c and Insulin ordered today.

## 2023-11-06 LAB — COMPREHENSIVE METABOLIC PANEL
ALT: 16 [IU]/L (ref 0–32)
AST: 19 [IU]/L (ref 0–40)
Albumin: 3.9 g/dL (ref 3.8–4.9)
Alkaline Phosphatase: 106 [IU]/L (ref 44–121)
BUN/Creatinine Ratio: 29 — ABNORMAL HIGH (ref 9–23)
BUN: 19 mg/dL (ref 6–24)
Bilirubin Total: 0.3 mg/dL (ref 0.0–1.2)
CO2: 24 mmol/L (ref 20–29)
Calcium: 9.4 mg/dL (ref 8.7–10.2)
Chloride: 100 mmol/L (ref 96–106)
Creatinine, Ser: 0.66 mg/dL (ref 0.57–1.00)
Globulin, Total: 3 g/dL (ref 1.5–4.5)
Glucose: 94 mg/dL (ref 70–99)
Potassium: 4.4 mmol/L (ref 3.5–5.2)
Sodium: 139 mmol/L (ref 134–144)
Total Protein: 6.9 g/dL (ref 6.0–8.5)
eGFR: 105 mL/min/{1.73_m2} (ref >59)

## 2023-11-06 LAB — LIPID PANEL WITH LDL/HDL RATIO
Cholesterol, Total: 235 mg/dL — ABNORMAL HIGH (ref 100–199)
HDL: 52 mg/dL (ref >39)
LDL Chol Calc (NIH): 161 mg/dL — ABNORMAL HIGH (ref 0–99)
LDL/HDL Ratio: 3.1 {ratio} (ref 0.0–3.2)
Triglycerides: 123 mg/dL (ref 0–149)
VLDL Cholesterol Cal: 22 mg/dL (ref 5–40)

## 2023-11-06 LAB — VITAMIN D 25 HYDROXY (VIT D DEFICIENCY, FRACTURES): Vit D, 25-Hydroxy: 31.1 ng/mL (ref 30.0–100.0)

## 2023-11-06 LAB — HEMOGLOBIN A1C
Est. average glucose Bld gHb Est-mCnc: 128 mg/dL
Hgb A1c MFr Bld: 6.1 % — ABNORMAL HIGH (ref 4.8–5.6)

## 2023-11-06 LAB — INSULIN, RANDOM: INSULIN: 10.5 u[IU]/mL (ref 2.6–24.9)

## 2023-12-17 ENCOUNTER — Encounter (INDEPENDENT_AMBULATORY_CARE_PROVIDER_SITE_OTHER): Payer: Self-pay | Admitting: Family Medicine

## 2023-12-17 ENCOUNTER — Ambulatory Visit (INDEPENDENT_AMBULATORY_CARE_PROVIDER_SITE_OTHER): Payer: BC Managed Care – PPO | Admitting: Family Medicine

## 2023-12-17 VITALS — BP 129/81 | HR 91 | Temp 97.7°F | Ht 68.0 in | Wt 396.0 lb

## 2023-12-17 DIAGNOSIS — E559 Vitamin D deficiency, unspecified: Secondary | ICD-10-CM

## 2023-12-17 DIAGNOSIS — R7303 Prediabetes: Secondary | ICD-10-CM | POA: Diagnosis not present

## 2023-12-17 DIAGNOSIS — I10 Essential (primary) hypertension: Secondary | ICD-10-CM | POA: Diagnosis not present

## 2023-12-17 DIAGNOSIS — E785 Hyperlipidemia, unspecified: Secondary | ICD-10-CM | POA: Diagnosis not present

## 2023-12-17 DIAGNOSIS — Z6841 Body Mass Index (BMI) 40.0 and over, adult: Secondary | ICD-10-CM

## 2023-12-17 DIAGNOSIS — E669 Obesity, unspecified: Secondary | ICD-10-CM

## 2023-12-17 MED ORDER — VITAMIN D (ERGOCALCIFEROL) 1.25 MG (50000 UNIT) PO CAPS: 50000.0000 [IU] | ORAL_CAPSULE | ORAL | 0 refills | Status: DC

## 2023-12-17 NOTE — Assessment & Plan Note (Signed)
Labs from prior appointment discussed today.  A1c increased to 6.1.  She realizes she had not been being as in control of evening meals at time of that draw due to mental fatigue from work.

## 2023-12-17 NOTE — Assessment & Plan Note (Signed)
The 10-year ASCVD risk score (Arnett DK, et al., 2019) is: 2.6%   Values used to calculate the score:     Age: 53 years     Sex: Female     Is Non-Hispanic African American: No     Diabetic: No     Tobacco smoker: No     Systolic Blood Pressure: 129 mmHg     Is BP treated: Yes     HDL Cholesterol: 52 mg/dL     Total Cholesterol: 235 mg/dL LDL minimal increase on labs drawn at last appointment.  LDL now at 161.  Repeat labs in 3-4 months to ensure LDL has decreased.

## 2023-12-17 NOTE — Assessment & Plan Note (Signed)
Labs from last appointment discussed today. Reiterated importance of vitamin d supplementation.  Vitamin d supplementation has been shown to decrease fatigue, decrease risk of progression to insulin resistance and then prediabetes, decreases risk of falling in older age and can even assist in decreasing depressive symptoms in PTSD.   Prescription for Vitamin D sent in.

## 2023-12-17 NOTE — Assessment & Plan Note (Signed)
Blood pressure well controlled today. She is doing well on chlorthalidone and hydrochlorothiazide.  No chest pain, chest pressure and headache.  No refills of her medication needed today.

## 2023-12-17 NOTE — Progress Notes (Signed)
SUBJECTIVE:  Chief Complaint: Obesity  Interim History: Patient went to [facility] to see her nephew and his wife and their 28 month old daughter.  She enjoyed herself.  For New Years she stayed in.  Last few weeks meal plan wise she was better able to stay consistent on meal plan.  She has been managing better but still has room for improvement particularly at work.  She is rearranging her schedule and is hopeful that will help with giving her a break at work to get food in.  Next few weeks she has work but is planning to take more time off. She has gotten really good at not doing work on her days off.   [facility] is here to discuss her progress with her obesity treatment plan. She is on the Category 4 Plan and states she is following her eating plan approximately 95 % of the time. She states she is walking 20-30 minutes 4 times per week.   OBJECTIVE: Visit Diagnoses: Problem List Items Addressed This Visit       Cardiovascular and Mediastinum   Essential hypertension - Primary   Blood pressure well controlled today. She is doing well on chlorthalidone and hydrochlorothiazide.  No chest pain, chest pressure and headache.  No refills of her medication needed today.        Other   Dyslipidemia   The 10-year ASCVD risk score (Arnett DK, et al., 2019) is: 2.6%   Values used to calculate the score:     Age: 53 years     Sex: Female     Is Non-Hispanic African American: No     Diabetic: No     Tobacco smoker: No     Systolic Blood Pressure: 129 mmHg     Is BP treated: Yes     HDL Cholesterol: 52 mg/dL     Total Cholesterol: 235 mg/dL LDL minimal increase on labs drawn at last appointment.  LDL now at 161.  Repeat labs in 3-4 months to ensure LDL has decreased.       Vitamin D deficiency   Labs from last appointment discussed today. Reiterated importance of vitamin d supplementation.  Vitamin d supplementation has been shown to decrease fatigue, decrease risk of progression to insulin  resistance and then prediabetes, decreases risk of falling in older age and can even assist in decreasing depressive symptoms in PTSD.   Prescription for Vitamin D sent in.        Relevant Medications   Vitamin D, Ergocalciferol, (DRISDOL) 1.25 MG (50000 UNIT) CAPS capsule   Prediabetes   Labs from prior appointment discussed today.  A1c increased to 6.1.  She realizes she had not been being as in control of evening meals at time of that draw due to mental fatigue from work.       Vitals Temp: 97.7 F (36.5 C) BP: 129/81 Pulse Rate: 91 SpO2: 99 %   Anthropometric Measurements Height: 5\' 8"  (1.727 m) Weight: (!) 396 lb (179.6 kg) BMI (Calculated): 60.23 Weight at Last Visit: 403 lb Weight Lost Since Last Visit: 7 Weight Gained Since Last Visit: 0 Starting Weight: 410 lb Total Weight Loss (lbs): 14 lb (6.35 kg)   Body Composition  Body Fat %: 61.7 % Fat Mass (lbs): 244.8 lbs Muscle Mass (lbs): 144.2 lbs Visceral Fat Rating : 27   Other Clinical Data Today's Visit #: 31 Starting Date: 02/09/20     ASSESSMENT AND PLAN:  Diet: Idali is currently in the action  stage of change. As such, her goal is to continue with weight loss efforts. She has agreed to Category 4 Plan.  She is planning to cook Sunday to prep for the week to have ready made meals for days she comes home late.  Exercise: Kitra has been instructed that some exercise is better than none for weight loss and overall health benefits.   Behavior Modification:  We discussed the following Behavioral Modification Strategies today: increasing lean protein intake, increasing vegetables, no skipping meals, meal planning and cooking strategies, better snacking choices, and planning for success.   No follow-ups on file.Marland Kitchen She was informed of the importance of frequent follow up visits to maximize her success with intensive lifestyle modifications for her multiple health conditions.  Attestation Statements:    Reviewed by clinician on day of visit: allergies, medications, problem list, medical history, surgical history, family history, social history, and previous encounter notes.      Reuben Likes, MD

## 2024-01-14 ENCOUNTER — Encounter (INDEPENDENT_AMBULATORY_CARE_PROVIDER_SITE_OTHER): Payer: Self-pay

## 2024-01-14 ENCOUNTER — Ambulatory Visit (INDEPENDENT_AMBULATORY_CARE_PROVIDER_SITE_OTHER): Payer: BC Managed Care – PPO | Admitting: Family Medicine

## 2024-01-14 ENCOUNTER — Encounter (INDEPENDENT_AMBULATORY_CARE_PROVIDER_SITE_OTHER): Payer: Self-pay | Admitting: Family Medicine

## 2024-01-14 ENCOUNTER — Other Ambulatory Visit (INDEPENDENT_AMBULATORY_CARE_PROVIDER_SITE_OTHER): Payer: Self-pay | Admitting: Family Medicine

## 2024-01-14 VITALS — BP 147/84 | HR 99 | Temp 98.2°F | Ht 68.0 in | Wt 395.0 lb

## 2024-01-14 DIAGNOSIS — G4733 Obstructive sleep apnea (adult) (pediatric): Secondary | ICD-10-CM

## 2024-01-14 DIAGNOSIS — Z6841 Body Mass Index (BMI) 40.0 and over, adult: Secondary | ICD-10-CM | POA: Diagnosis not present

## 2024-01-14 DIAGNOSIS — E559 Vitamin D deficiency, unspecified: Secondary | ICD-10-CM | POA: Diagnosis not present

## 2024-01-14 MED ORDER — ZEPBOUND 2.5 MG/0.5ML ~~LOC~~ SOAJ: 2.5000 mg | SUBCUTANEOUS | 0 refills | Status: DC

## 2024-01-14 MED ORDER — VITAMIN D (ERGOCALCIFEROL) 1.25 MG (50000 UNIT) PO CAPS: 50000.0000 [IU] | ORAL_CAPSULE | ORAL | 0 refills | Status: DC

## 2024-01-14 NOTE — Assessment & Plan Note (Signed)
Patient doing well on prescription strength Vitamin D.  No nausea, vomiting or muscle weakness noted.  Refill of vitamin D RX sent to pharmacy today.

## 2024-01-14 NOTE — Progress Notes (Signed)
   SUBJECTIVE:  Chief Complaint: Obesity  Interim History: She has been trying to stick to meal plan and increase her water intake over the last few weeks.  She sometimes increases her water intake when at home in order to make up for what she is doing when she is working.  She is starting to get some hunger after lunch even when she eats the entirety of the lunch meal.  Sometimes she even eats a larger portion in the afternoon because she isn't sure when she will have dinner. She mentions she is able to get full amount in at dinner.   [facility] is here to discuss her progress with her obesity treatment plan. She is on the Category 4 Plan and states she is following her eating plan approximately 95 % of the time. She states she is walking 20 minutes 3 times per week.   OBJECTIVE: Visit Diagnoses: Problem List Items Addressed This Visit   None   Vitals Temp: 98.2 F (36.8 C) BP: (!) 147/84 Pulse Rate: 99 SpO2: 97 %   Anthropometric Measurements Height: 5\' 8"  (1.727 m) Weight: (!) 395 lb (179.2 kg) BMI (Calculated): 60.07 Weight at Last Visit: 396 mlb Weight Lost Since Last Visit: 1 Weight Gained Since Last Visit: 0 Starting Weight: 410 lb Total Weight Loss (lbs): 15 lb (6.804 kg)   Body Composition  Body Fat %: 62 % Fat Mass (lbs): 245 lbs Muscle Mass (lbs): 142.8 lbs Visceral Fat Rating : 27   Other Clinical Data Today's Visit #: 32 Starting Date: 02/09/20     ASSESSMENT AND PLAN:  Diet: [facility] is currently in the action stage of change. As such, her goal is to continue with weight loss efforts. She has agreed to Category 4 Plan.  Exercise: [facility] has been instructed that some exercise is better than none for weight loss and overall health benefits.   Behavior Modification:  We discussed the following Behavioral Modification Strategies today: increasing lean protein intake, increase H2O intake, no skipping meals, meal planning and cooking strategies, and keeping  healthy foods in the home.   No follow-ups on file.Marland Kitchen She was informed of the importance of frequent follow up visits to maximize her success with intensive lifestyle modifications for her multiple health conditions.  Attestation Statements:   Reviewed by clinician on day of visit: allergies, medications, problem list, medical history, surgical history, family history, social history, and previous encounter notes.    Sharon Likes, MD

## 2024-01-14 NOTE — Assessment & Plan Note (Signed)
Sleep study showing Obstructive Sleep Apnea (OSA), at AHI 13/h with strong supine  Exacerbation.  She has a CPAP at home that she uses consistently. We discussed starting Zepbound for treatment of her OSA.  Discussed that I cannot assure her of her insurance coverage for this medication.  She is agreeable to starting this medication and mentions she previously did very well on Wegovy. Benefits, risks, possible side effects discussed today.  Prescription sent in to pharmacy.

## 2024-01-14 NOTE — Assessment & Plan Note (Signed)
Starting weight: 410 on 02/09/20 Peak weight: 411 BMR: 2880 Previous obesity management: GNFAOZ- patient did very well on this with weight loss acheived Body Fat %: 62% Starting Meal Plan: Category 4  Patient insurance coverage does not include anti obesity medication at this time.  She is following Category 4 much more strictly.  Continue with current plan

## 2024-02-11 ENCOUNTER — Ambulatory Visit (INDEPENDENT_AMBULATORY_CARE_PROVIDER_SITE_OTHER): Payer: BC Managed Care – PPO | Admitting: Family Medicine

## 2024-02-11 ENCOUNTER — Encounter (INDEPENDENT_AMBULATORY_CARE_PROVIDER_SITE_OTHER): Payer: Self-pay | Admitting: Family Medicine

## 2024-02-11 VITALS — HR 108 | Temp 97.9°F | Ht 68.0 in | Wt 395.0 lb

## 2024-02-11 DIAGNOSIS — E559 Vitamin D deficiency, unspecified: Secondary | ICD-10-CM | POA: Diagnosis not present

## 2024-02-11 DIAGNOSIS — F32A Depression, unspecified: Secondary | ICD-10-CM | POA: Diagnosis not present

## 2024-02-11 DIAGNOSIS — F419 Anxiety disorder, unspecified: Secondary | ICD-10-CM | POA: Diagnosis not present

## 2024-02-11 DIAGNOSIS — Z6841 Body Mass Index (BMI) 40.0 and over, adult: Secondary | ICD-10-CM

## 2024-02-11 DIAGNOSIS — I10 Essential (primary) hypertension: Secondary | ICD-10-CM

## 2024-02-11 MED ORDER — CHLORTHALIDONE 25 MG PO TABS: 25.0000 mg | ORAL_TABLET | Freq: Every day | ORAL | 0 refills | Status: DC

## 2024-02-11 MED ORDER — LISINOPRIL 5 MG PO TABS: 5.0000 mg | ORAL_TABLET | Freq: Every day | ORAL | 0 refills | Status: DC

## 2024-02-11 MED ORDER — SERTRALINE HCL 50 MG PO TABS: 50.0000 mg | ORAL_TABLET | Freq: Every day | ORAL | 0 refills | Status: DC

## 2024-02-11 MED ORDER — VITAMIN D (ERGOCALCIFEROL) 1.25 MG (50000 UNIT) PO CAPS: 50000.0000 [IU] | ORAL_CAPSULE | ORAL | 0 refills | Status: DC

## 2024-02-11 NOTE — Assessment & Plan Note (Signed)
 Doing well on sertraline.  Quiet a bit of stress with her work and work demands.  She needs a refill of sertraline today.  No change in dose or medication as medication has improved symptoms.

## 2024-02-11 NOTE — Progress Notes (Signed)
 SUBJECTIVE:  Chief Complaint: Obesity  Interim History: Patient has been walking more and taking more steps than she did previously.  Food intake has been better as she has had time to meal plan and prep.  Even when she gets home she has prepared options available to her.  She is making time in her schedule consistently to plan and prep.  Unfortunately Zepbound was not covered by her insurance.  She has an upcoming family beach trip- she is looking forward to going to the beach and not working and taking a break and refocusing.   Sharon Robertson is here to discuss her progress with her obesity treatment plan. She is on the Category 4 Plan and states she is following her eating plan approximately 95 % of the time. She states she is exercising 30 minutes 4 times per week.   OBJECTIVE: Visit Diagnoses: Problem List Items Addressed This Visit       Cardiovascular and Mediastinum   Essential hypertension - Primary   Doing well on combination medication therapy.  Needs refill of chlorthalidone and lisinopril today.      Relevant Medications   chlorthalidone (HYGROTON) 25 MG tablet   lisinopril (ZESTRIL) 5 MG tablet     Other   Morbid obesity (HCC)   She wants to continue to work on getting protein in, eating on plan, drinking more water.  She wants to do more activity on her days off.  Follow anthropometric data.  Anthropometric Measurements Height: 5\' 8"  (1.727 m) Weight: (!) 395 lb (179.2 kg) BMI (Calculated): 60.07 Weight at Last Visit: 395 lb Weight Lost Since Last Visit: 0 Weight Gained Since Last Visit: 0 Starting Weight: 410 lb Total Weight Loss (lbs): 15 lb (6.804 kg) Body Composition  Body Fat %: 62.5 % Fat Mass (lbs): 246.8 lbs Muscle Mass (lbs): 140.8 lbs Visceral Fat Rating : 27       Vitamin D deficiency   On Vitamin D prescription and doing well.  Needs refill today      Relevant Medications   Vitamin D, Ergocalciferol, (DRISDOL) 1.25 MG (50000 UNIT) CAPS capsule    Anxiety and depression   Doing well on sertraline.  Quiet a bit of stress with her work and work demands.  She needs a refill of sertraline today.  No change in dose or medication as medication has improved symptoms.      Relevant Medications   sertraline (ZOLOFT) 50 MG tablet   Other Visit Diagnoses       BMI 60.0-69.9, adult (HCC)           No data recorded    No data recorded     02/11/2024    2:00 PM 01/14/2024   10:00 AM 12/17/2023    9:00 AM  Vitals with BMI  Height 5\' 8"  5\' 8"  5\' 8"   Weight 395 lbs 395 lbs 396 lbs  BMI 60.07 60.07 60.23  Systolic  147 129  Diastolic  84 81  Pulse 108 99 91     ASSESSMENT AND PLAN:  Diet: Sharon Robertson is currently in the action stage of change. As such, her goal is to continue with weight loss efforts and has agreed to the Category 4 Plan.   Exercise:  For substantial health benefits, adults should do at least 150 minutes (2 hours and 30 minutes) a week of moderate-intensity, or 75 minutes (1 hour and 15 minutes) a week of vigorous-intensity aerobic physical activity, or an equivalent combination of moderate- and vigorous-intensity aerobic  activity. Aerobic activity should be performed in episodes of at least 10 minutes, and preferably, it should be spread throughout the week.  Behavior Modification:  We discussed the following Behavioral Modification Strategies today: increasing lean protein intake, no skipping meals, meal planning and cooking strategies, avoiding temptations, and planning for success.   No follow-ups on file.Marland Kitchen She was informed of the importance of frequent follow up visits to maximize her success with intensive lifestyle modifications for her multiple health conditions.  Attestation Statements:   Reviewed by clinician on day of visit: allergies, medications, problem list, medical history, surgical history, family history, social history, and previous encounter notes.   Reuben Likes, MD

## 2024-02-11 NOTE — Assessment & Plan Note (Addendum)
 She wants to continue to work on getting protein in, eating on plan, drinking more water.  She wants to do more activity on her days off.  Follow anthropometric data.  Anthropometric Measurements Height: 5\' 8"  (1.727 m) Weight: (!) 395 lb (179.2 kg) BMI (Calculated): 60.07 Weight at Last Visit: 395 lb Weight Lost Since Last Visit: 0 Weight Gained Since Last Visit: 0 Starting Weight: 410 lb Total Weight Loss (lbs): 15 lb (6.804 kg) Body Composition  Body Fat %: 62.5 % Fat Mass (lbs): 246.8 lbs Muscle Mass (lbs): 140.8 lbs Visceral Fat Rating : 27

## 2024-02-19 NOTE — Assessment & Plan Note (Signed)
 On Vitamin D prescription and doing well.  Needs refill today

## 2024-02-19 NOTE — Assessment & Plan Note (Signed)
 Doing well on combination medication therapy.  Needs refill of chlorthalidone and lisinopril today.

## 2024-02-27 ENCOUNTER — Other Ambulatory Visit (INDEPENDENT_AMBULATORY_CARE_PROVIDER_SITE_OTHER): Payer: Self-pay | Admitting: Family Medicine

## 2024-02-27 DIAGNOSIS — E559 Vitamin D deficiency, unspecified: Secondary | ICD-10-CM

## 2024-03-24 ENCOUNTER — Ambulatory Visit (INDEPENDENT_AMBULATORY_CARE_PROVIDER_SITE_OTHER): Admitting: Family Medicine

## 2024-03-24 ENCOUNTER — Encounter (INDEPENDENT_AMBULATORY_CARE_PROVIDER_SITE_OTHER): Payer: Self-pay | Admitting: Family Medicine

## 2024-03-24 VITALS — BP 121/79 | HR 91 | Temp 97.8°F | Ht 68.0 in | Wt 393.0 lb

## 2024-03-24 DIAGNOSIS — Z6841 Body Mass Index (BMI) 40.0 and over, adult: Secondary | ICD-10-CM

## 2024-03-24 DIAGNOSIS — E559 Vitamin D deficiency, unspecified: Secondary | ICD-10-CM

## 2024-03-24 MED ORDER — VITAMIN D (ERGOCALCIFEROL) 1.25 MG (50000 UNIT) PO CAPS: 50000.0000 [IU] | ORAL_CAPSULE | ORAL | 0 refills | Status: DC

## 2024-03-24 NOTE — Progress Notes (Unsigned)
   SUBJECTIVE:  Chief Complaint: Obesity  Interim History: Patient here for follow up.  Patient has had a significant more stressful time at work over the last 5 weeks.  She took some days off at the beginning of Easter and she was called from work. She is trying to set boundaries at work but is getting aggravated by lack of respect.  She is planning a trip to the beach next week [facility]) and she will have her own car.  She is also going to a conference in [place]  in the next few weeks. She realizes she hasn't been as on plan getting lunch in daily.    Sharon Robertson is here to discuss her progress with her obesity treatment plan. She is on the Category 4 Plan and states she is following her eating plan approximately 80 % of the time. She states she is walking 30 minutes 2 times per week.   OBJECTIVE: Visit Diagnoses: Problem List Items Addressed This Visit       Other   Vitamin D  deficiency   Relevant Medications   Vitamin D , Ergocalciferol , (DRISDOL ) 1.25 MG (50000 UNIT) CAPS capsule    Vitals Temp: 97.8 F (36.6 C) BP: 121/79 Pulse Rate: 91 SpO2: 97 %   Anthropometric Measurements Height: 5\' 8"  (1.727 m) Weight: (!) 393 lb (178.3 kg) BMI (Calculated): 59.77 Weight at Last Visit: 395 lb Weight Lost Since Last Visit: 2 Weight Gained Since Last Visit: 0 Starting Weight: 410 lb Total Weight Loss (lbs): 17 lb (7.711 kg)   Body Composition  Body Fat %: 61.2 % Fat Mass (lbs): 240.6 lbs Muscle Mass (lbs): 145 lbs Visceral Fat Rating : 27   Other Clinical Data Fasting: yes Labs: no Today's Visit #: 34 Starting Date: 02/09/20 Comments: Cat 4     ASSESSMENT AND PLAN:  Diet: Sharon Robertson is currently in the action stage of change. As such, her goal is to continue with weight loss efforts and has agreed to the Category 4 Plan. She is going to make ready options for her to eat at lunch.  She also wants to refocus dinner in order to get all the food in on plan.  Exercise:  For  substantial health benefits, adults should do at least 150 minutes (2 hours and 30 minutes) a week of moderate-intensity, or 75 minutes (1 hour and 15 minutes) a week of vigorous-intensity aerobic physical activity, or an equivalent combination of moderate- and vigorous-intensity aerobic activity. Aerobic activity should be performed in episodes of at least 10 minutes, and preferably, it should be spread throughout the week.  Behavior Modification:  We discussed the following Behavioral Modification Strategies today: increasing lean protein intake, decreasing simple carbohydrates, no skipping meals, meal planning and cooking strategies, and planning for success.   No follow-ups on file.Sharon Robertson She was informed of the importance of frequent follow up visits to maximize her success with intensive lifestyle modifications for her multiple health conditions.  Attestation Statements:   Reviewed by clinician on day of visit: allergies, medications, problem list, medical history, surgical history, family history, social history, and previous encounter notes.   Donaciano Frizzle, MD

## 2024-03-25 ENCOUNTER — Other Ambulatory Visit (INDEPENDENT_AMBULATORY_CARE_PROVIDER_SITE_OTHER): Payer: Self-pay | Admitting: Family Medicine

## 2024-03-25 DIAGNOSIS — E559 Vitamin D deficiency, unspecified: Secondary | ICD-10-CM

## 2024-03-25 NOTE — Assessment & Plan Note (Signed)
 Patient is working on consistent intake of nutritious foods and not skipping meals.  She is aiming to stop skipping meals and put more effort into meal planning and prepping in the next few weeks.

## 2024-03-25 NOTE — Assessment & Plan Note (Signed)
 Patient needs a refill of her prescription strength Vitamin D  today.  No nausea, vomiting or muscle weakness noted.  Refill sent into pharmacy.

## 2024-04-21 ENCOUNTER — Ambulatory Visit (INDEPENDENT_AMBULATORY_CARE_PROVIDER_SITE_OTHER): Admitting: Family Medicine

## 2024-04-21 ENCOUNTER — Encounter (INDEPENDENT_AMBULATORY_CARE_PROVIDER_SITE_OTHER): Payer: Self-pay | Admitting: Family Medicine

## 2024-04-21 VITALS — BP 132/79 | HR 89 | Temp 97.7°F | Ht 68.0 in | Wt 395.0 lb

## 2024-04-21 DIAGNOSIS — R7303 Prediabetes: Secondary | ICD-10-CM | POA: Diagnosis not present

## 2024-04-21 DIAGNOSIS — I10 Essential (primary) hypertension: Secondary | ICD-10-CM

## 2024-04-21 DIAGNOSIS — E7849 Other hyperlipidemia: Secondary | ICD-10-CM

## 2024-04-21 DIAGNOSIS — E559 Vitamin D deficiency, unspecified: Secondary | ICD-10-CM

## 2024-04-21 DIAGNOSIS — Z6841 Body Mass Index (BMI) 40.0 and over, adult: Secondary | ICD-10-CM

## 2024-04-21 MED ORDER — VITAMIN D (ERGOCALCIFEROL) 1.25 MG (50000 UNIT) PO CAPS: 50000.0000 [IU] | ORAL_CAPSULE | ORAL | 0 refills | Status: DC

## 2024-04-21 NOTE — Progress Notes (Signed)
 SUBJECTIVE:  Chief Complaint: Obesity  Interim History: Over the last month she made some changes at work and has rearranged her schedule to give her more time.  She was doing well up until a week ago and has been dealing with allergy issues.  She was going to bed late and getting up early but now is getting back on track. She has no plans for Memorial Day weekend except to rest. She is feeling good with using Sunday for meal prep and then having readily available food and meals for herself for the week.  For the month of June she doesn't have anything planned yet but is considering going to [facility].  Sharon Robertson is here to discuss her progress with her obesity treatment plan. She is on the Category 4 Plan and states she is following her eating plan approximately 95 % of the time. She states she is walking 15 minutes 4 times per week.   OBJECTIVE: Visit Diagnoses: Problem List Items Addressed This Visit       Cardiovascular and Mediastinum   Essential hypertension   Blood pressure well controlled today.  No chest pain, chest pressure or headache. Ordering a CMP today to check on electrolytes and kidney function.      Relevant Orders   Comprehensive metabolic panel with GFR (Completed)     Other   Morbid obesity (HCC)   Anthropometric Measurements Height: 5\' 8"  (1.727 m) Weight: (!) 395 lb (179.2 kg) BMI (Calculated): 60.07 Weight at Last Visit: 393 Weight Lost Since Last Visit: 0 Weight Gained Since Last Visit: 2 Starting Weight: 410 lb Total Weight Loss (lbs): 15 lb (6.804 kg) Body Composition  Body Fat %: 62.1 % Fat Mass (lbs): 245.8 lbs Muscle Mass (lbs): 142.4 lbs Visceral Fat Rating : 27 Other Clinical Data Today's Visit #: 35 Starting Date: 02/09/20 Comments: Cat 4       Vitamin D  deficiency - Primary   On prescription strength Vitamin D  previously.  Repeating Vitamin D  level to assess response and refilling Vitamin D  today.      Relevant Medications   Vitamin  D, Ergocalciferol , (DRISDOL ) 1.25 MG (50000 UNIT) CAPS capsule   Other Relevant Orders   VITAMIN D  25 Hydroxy (Vit-D Deficiency, Fractures) (Completed)   Prediabetes   Previously elevated A1c.  Still occasionally experiencing carbohydrate cravings.  Repeating A1c and Insulin  level today.  Will discuss lab results at next appointment.      Relevant Orders   Hemoglobin A1c (Completed)   Insulin , random (Completed)   Other hyperlipidemia   The 10-year ASCVD risk score (Arnett DK, et al., 2019) is: 3.5%   Values used to calculate the score:     Age: 53 years     Sex: Female     Is Non-Hispanic African American: No     Diabetic: No     Tobacco smoker: No     Systolic Blood Pressure: 132 mmHg     Is BP treated: Yes     HDL Cholesterol: 43 mg/dL     Total Cholesterol: 239 mg/dL   Not on medication.  Will repeat FLP today and reassess risk to discuss treatment plan at next appointment.      Relevant Orders   Lipid Panel With LDL/HDL Ratio (Completed)   Other Visit Diagnoses       BMI 60.0-69.9, adult (HCC)           No data recorded       04/21/2024   10:19 AM  04/21/2024   10:00 AM 03/24/2024    9:00 AM  Vitals with BMI  Height  5\' 8"  5\' 8"   Weight  395 lbs 393 lbs  BMI  60.07 59.77  Systolic 132 142 161  Diastolic 79 89 79  Pulse  89 91      ASSESSMENT AND PLAN:  Diet: Sharon Robertson is currently in the action stage of change. As such, her goal is to continue with weight loss efforts and has agreed to the Category 4 Plan.   Exercise:  All adults should avoid inactivity. Some activity is better than none, and adults who participate in any amount of physical activity, gain some health benefits. She is going to continue walking and is going to start swimming when it reopens on Memorial Day.  Behavior Modification:  We discussed the following Behavioral Modification Strategies today: increasing lean protein intake, decreasing simple carbohydrates, increasing vegetables, no  skipping meals, meal planning and cooking strategies, and keeping healthy foods in the home.   Return in about 4 weeks (around 05/19/2024).   She was informed of the importance of frequent follow up visits to maximize her success with intensive lifestyle modifications for her multiple health conditions.  Attestation Statements:   Reviewed by clinician on day of visit: allergies, medications, problem list, medical history, surgical history, family history, social history, and previous encounter notes.     Donaciano Frizzle, MD

## 2024-04-22 LAB — LIPID PANEL WITH LDL/HDL RATIO
Cholesterol, Total: 239 mg/dL — ABNORMAL HIGH (ref 100–199)
HDL: 43 mg/dL (ref >39)
LDL Chol Calc (NIH): 170 mg/dL — ABNORMAL HIGH (ref 0–99)
LDL/HDL Ratio: 4 ratio — ABNORMAL HIGH (ref 0.0–3.2)
Triglycerides: 141 mg/dL (ref 0–149)
VLDL Cholesterol Cal: 26 mg/dL (ref 5–40)

## 2024-04-22 LAB — COMPREHENSIVE METABOLIC PANEL WITH GFR
ALT: 27 IU/L (ref 0–32)
AST: 23 IU/L (ref 0–40)
Albumin: 4 g/dL (ref 3.8–4.9)
Alkaline Phosphatase: 108 IU/L (ref 44–121)
BUN/Creatinine Ratio: 24 — ABNORMAL HIGH (ref 9–23)
BUN: 17 mg/dL (ref 6–24)
Bilirubin Total: 0.4 mg/dL (ref 0.0–1.2)
CO2: 20 mmol/L (ref 20–29)
Calcium: 9.7 mg/dL (ref 8.7–10.2)
Chloride: 101 mmol/L (ref 96–106)
Creatinine, Ser: 0.71 mg/dL (ref 0.57–1.00)
Globulin, Total: 3.3 g/dL (ref 1.5–4.5)
Glucose: 107 mg/dL — ABNORMAL HIGH (ref 70–99)
Potassium: 4.5 mmol/L (ref 3.5–5.2)
Sodium: 138 mmol/L (ref 134–144)
Total Protein: 7.3 g/dL (ref 6.0–8.5)
eGFR: 102 mL/min/{1.73_m2} (ref >59)

## 2024-04-22 LAB — HEMOGLOBIN A1C
Est. average glucose Bld gHb Est-mCnc: 123 mg/dL
Hgb A1c MFr Bld: 5.9 % — ABNORMAL HIGH (ref 4.8–5.6)

## 2024-04-22 LAB — VITAMIN D 25 HYDROXY (VIT D DEFICIENCY, FRACTURES): Vit D, 25-Hydroxy: 25.9 ng/mL — ABNORMAL LOW (ref 30.0–100.0)

## 2024-04-22 LAB — INSULIN, RANDOM: INSULIN: 15.2 u[IU]/mL (ref 2.6–24.9)

## 2024-05-02 NOTE — Assessment & Plan Note (Signed)
 Blood pressure well controlled today.  No chest pain, chest pressure or headache. Ordering a CMP today to check on electrolytes and kidney function.

## 2024-05-02 NOTE — Assessment & Plan Note (Signed)
 Anthropometric Measurements Height: 5\' 8"  (1.727 m) Weight: (!) 395 lb (179.2 kg) BMI (Calculated): 60.07 Weight at Last Visit: 393 Weight Lost Since Last Visit: 0 Weight Gained Since Last Visit: 2 Starting Weight: 410 lb Total Weight Loss (lbs): 15 lb (6.804 kg) Body Composition  Body Fat %: 62.1 % Fat Mass (lbs): 245.8 lbs Muscle Mass (lbs): 142.4 lbs Visceral Fat Rating : 27 Other Clinical Data Today's Visit #: 35 Starting Date: 02/09/20 Comments: Cat 4

## 2024-05-02 NOTE — Assessment & Plan Note (Signed)
 Previously elevated A1c.  Still occasionally experiencing carbohydrate cravings.  Repeating A1c and Insulin  level today.  Will discuss lab results at next appointment.

## 2024-05-02 NOTE — Assessment & Plan Note (Signed)
 The 10-year ASCVD risk score (Arnett DK, et al., 2019) is: 3.5%   Values used to calculate the score:     Age: 53 years     Sex: Female     Is Non-Hispanic African American: No     Diabetic: No     Tobacco smoker: No     Systolic Blood Pressure: 132 mmHg     Is BP treated: Yes     HDL Cholesterol: 43 mg/dL     Total Cholesterol: 239 mg/dL   Not on medication.  Will repeat FLP today and reassess risk to discuss treatment plan at next appointment.

## 2024-05-02 NOTE — Assessment & Plan Note (Signed)
 On prescription strength Vitamin D  previously.  Repeating Vitamin D  level to assess response and refilling Vitamin D  today.

## 2024-05-17 ENCOUNTER — Other Ambulatory Visit (INDEPENDENT_AMBULATORY_CARE_PROVIDER_SITE_OTHER): Payer: Self-pay | Admitting: Family Medicine

## 2024-05-17 DIAGNOSIS — E559 Vitamin D deficiency, unspecified: Secondary | ICD-10-CM

## 2024-05-19 ENCOUNTER — Encounter (INDEPENDENT_AMBULATORY_CARE_PROVIDER_SITE_OTHER): Payer: Self-pay | Admitting: Family Medicine

## 2024-05-19 ENCOUNTER — Ambulatory Visit (INDEPENDENT_AMBULATORY_CARE_PROVIDER_SITE_OTHER): Admitting: Family Medicine

## 2024-05-19 VITALS — BP 139/84 | HR 86 | Temp 97.6°F | Ht 68.0 in | Wt >= 6400 oz

## 2024-05-19 DIAGNOSIS — I10 Essential (primary) hypertension: Secondary | ICD-10-CM

## 2024-05-19 DIAGNOSIS — F32A Depression, unspecified: Secondary | ICD-10-CM | POA: Diagnosis not present

## 2024-05-19 DIAGNOSIS — E7849 Other hyperlipidemia: Secondary | ICD-10-CM

## 2024-05-19 DIAGNOSIS — E559 Vitamin D deficiency, unspecified: Secondary | ICD-10-CM

## 2024-05-19 DIAGNOSIS — Z6841 Body Mass Index (BMI) 40.0 and over, adult: Secondary | ICD-10-CM

## 2024-05-19 DIAGNOSIS — E785 Hyperlipidemia, unspecified: Secondary | ICD-10-CM

## 2024-05-19 DIAGNOSIS — F419 Anxiety disorder, unspecified: Secondary | ICD-10-CM

## 2024-05-19 MED ORDER — LISINOPRIL 5 MG PO TABS
5.0000 mg | ORAL_TABLET | Freq: Every day | ORAL | 0 refills | Status: DC
Start: 2024-05-19 — End: 2024-08-18

## 2024-05-19 MED ORDER — VITAMIN D (ERGOCALCIFEROL) 1.25 MG (50000 UNIT) PO CAPS: 50000.0000 [IU] | ORAL_CAPSULE | ORAL | 0 refills | Status: DC

## 2024-05-19 MED ORDER — ROSUVASTATIN CALCIUM 20 MG PO TABS: 20.0000 mg | ORAL_TABLET | Freq: Every day | ORAL | 0 refills | Status: DC

## 2024-05-19 MED ORDER — CHLORTHALIDONE 25 MG PO TABS
25.0000 mg | ORAL_TABLET | Freq: Every day | ORAL | 0 refills | Status: DC
Start: 2024-05-19 — End: 2024-08-18

## 2024-05-19 MED ORDER — SERTRALINE HCL 50 MG PO TABS: 50.0000 mg | ORAL_TABLET | Freq: Every day | ORAL | 0 refills | Status: DC

## 2024-05-19 NOTE — Assessment & Plan Note (Signed)
 The 10-year ASCVD risk score (Arnett DK, et al., 2019) is: 4.1%   Values used to calculate the score:     Age: 53 years     Clincally relevant sex: Female     Is Non-Hispanic African American: No     Diabetic: No     Tobacco smoker: No     Systolic Blood Pressure: 139 mmHg     Is BP treated: Yes     HDL Cholesterol: 43 mg/dL     Total Cholesterol: 239 mg/dL

## 2024-05-19 NOTE — Progress Notes (Unsigned)
 SUBJECTIVE:  Chief Complaint: Obesity  Interim History: Patient had a celebratory week last week for her birthday.  She did overindulge in cake and that spiraled her into eating more off plan.  She mentions all she did for her birthday was indulge in food.  Her goal is to get back to where she was before. She is walking longer but hasn't been able to incorporate swimming the way she wanted to.  She is planning to get back to the foods she was taking in before.  Sharon Robertson is here to discuss her progress with her obesity treatment plan. She is on the Category 4 Plan and states she is following her eating plan approximately 90 % of the time. She states she is exercising 30 minutes 4 times per week.   OBJECTIVE: Visit Diagnoses: Problem List Items Addressed This Visit       Cardiovascular and Mediastinum   Essential hypertension - Primary   Relevant Medications   chlorthalidone  (HYGROTON ) 25 MG tablet   lisinopril  (ZESTRIL ) 5 MG tablet   rosuvastatin (CRESTOR) 20 MG tablet     Other   Morbid obesity (HCC)   Vitamin D  deficiency   Relevant Medications   Vitamin D , Ergocalciferol , (DRISDOL ) 1.25 MG (50000 UNIT) CAPS capsule   Other hyperlipidemia   Relevant Medications   chlorthalidone  (HYGROTON ) 25 MG tablet   lisinopril  (ZESTRIL ) 5 MG tablet   rosuvastatin (CRESTOR) 20 MG tablet   Anxiety and depression   Relevant Medications   sertraline  (ZOLOFT ) 50 MG tablet   Other Visit Diagnoses       BMI 60.0-69.9, adult (HCC)           Vitals Temp: 97.6 F (36.4 C) BP: 139/84 Pulse Rate: 86 SpO2: 96 %   Anthropometric Measurements Height: 5' 8 (1.727 m) Weight: (!) 404 lb (183.3 kg) BMI (Calculated): 61.44 Weight at Last Visit: 395 lb Weight Lost Since Last Visit: 0 Weight Gained Since Last Visit: 9 lb Starting Weight: 410 lb Total Weight Loss (lbs): 6 lb (2.722 kg)   Body Composition  Body Fat %: 63.7 % Fat Mass (lbs): 257.8 lbs Muscle Mass (lbs): 139.6  lbs Visceral Fat Rating : 29   Other Clinical Data Fasting: yes Labs: no Today's Visit #: 36 Starting Date: 02/09/20 Comments: cat 4     ASSESSMENT AND PLAN:  Diet: Sharon Robertson is currently in the action stage of change. As such, her goal is to continue with weight loss efforts and has agreed to the Category 4 Plan.   Exercise:  For substantial health benefits, adults should do at least 150 minutes (2 hours and 30 minutes) a week of moderate-intensity, or 75 minutes (1 hour and 15 minutes) a week of vigorous-intensity aerobic physical activity, or an equivalent combination of moderate- and vigorous-intensity aerobic activity. Aerobic activity should be performed in episodes of at least 10 minutes, and preferably, it should be spread throughout the week.  Behavior Modification:  We discussed the following Behavioral Modification Strategies today: increasing lean protein intake, decreasing simple carbohydrates, increasing vegetables, meal planning and cooking strategies, keeping healthy foods in the home, and planning for success.   Return in about 4 weeks (around 06/16/2024).   She was informed of the importance of frequent follow up visits to maximize her success with intensive lifestyle modifications for her multiple health conditions.  Attestation Statements:   Reviewed by clinician on day of visit: allergies, medications, problem list, medical history, surgical history, family history, social history, and previous encounter  notes.    Donaciano Frizzle, MD

## 2024-05-20 NOTE — Assessment & Plan Note (Signed)
 Discussed lab from prior visit today.  LDL still elevated despite lifestyle changes.  Will start crestor and need repeat lab to assess response in 3 months.

## 2024-05-20 NOTE — Assessment & Plan Note (Signed)
 BP controlled today.  No chest pain, chest pressure or headache.  Needs a refill of chlorthalidone  and lisinopril  today.  Refills sent to pharmacy.

## 2024-05-20 NOTE — Assessment & Plan Note (Signed)
 Patient on prescription strength Vitamin D  and needs a refill today.  No nausea, vomiting or muscle weakness mentioned.

## 2024-05-20 NOTE — Assessment & Plan Note (Signed)
 On sertraline  50mg  daily.  No suicidal or homicidal ideation.  Needs refill of sertraline  today.

## 2024-06-16 ENCOUNTER — Ambulatory Visit (INDEPENDENT_AMBULATORY_CARE_PROVIDER_SITE_OTHER): Admitting: Family Medicine

## 2024-06-16 ENCOUNTER — Encounter (INDEPENDENT_AMBULATORY_CARE_PROVIDER_SITE_OTHER): Payer: Self-pay | Admitting: Family Medicine

## 2024-06-16 VITALS — BP 130/80 | HR 84 | Temp 97.8°F | Ht 68.0 in | Wt 399.0 lb

## 2024-06-16 DIAGNOSIS — Z6841 Body Mass Index (BMI) 40.0 and over, adult: Secondary | ICD-10-CM | POA: Diagnosis not present

## 2024-06-16 DIAGNOSIS — I10 Essential (primary) hypertension: Secondary | ICD-10-CM | POA: Diagnosis not present

## 2024-06-16 DIAGNOSIS — E559 Vitamin D deficiency, unspecified: Secondary | ICD-10-CM | POA: Diagnosis not present

## 2024-06-16 MED ORDER — VITAMIN D (ERGOCALCIFEROL) 1.25 MG (50000 UNIT) PO CAPS: 50000.0000 [IU] | ORAL_CAPSULE | ORAL | 0 refills | Status: DC

## 2024-06-16 NOTE — Progress Notes (Signed)
   SUBJECTIVE:  Chief Complaint: Obesity  Interim History: Patient got back into her routine since last appointment.  She has been able to swim a bit when it is not raining.  She has gotten more on track with her meals. She is going to continue to stick to her meal plan over the next few weeks. She has made a schedule and she has gotten very good at sticking with that schedule.  She has set some boundaries with family.  She is planning on going to 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   Sharon Robertson is here to discuss her progress with her obesity treatment plan. She is on the Category 4 Plan and states she is following her eating plan approximately 95 % of the time. She states she is exercising 30-45 minutes 5 times per week.   OBJECTIVE: Visit Diagnoses: Problem List Items Addressed This Visit       Cardiovascular and Mediastinum   Essential hypertension - Primary     Other   Morbid obesity (HCC)   Vitamin D  deficiency   Relevant Medications   Vitamin D , Ergocalciferol , (DRISDOL ) 1.25 MG (50000 UNIT) CAPS capsule   Other Visit Diagnoses       BMI 60.0-69.9, adult (HCC)           Vitals Temp: 97.8 F (36.6 C) BP: 130/80 Pulse Rate: 84 SpO2: 97 %   Anthropometric Measurements Height: 5' 8 (1.727 m) Weight: (!) 399 lb (181 kg) BMI (Calculated): 60.68 Weight at Last Visit: 404 lb Weight Lost Since Last Visit: 5 Weight Gained Since Last Visit: 0 Starting Weight: 410 lb Total Weight Loss (lbs): 11 lb (4.99 kg)   No data recorded Other Clinical Data Fasting: no Labs: no Today's Visit #: 37 Starting Date: 02/09/20 Comments: Cat 4     ASSESSMENT AND PLAN: Assessment & Plan Essential hypertension Blood pressure well controlled today.  No chest pain, chest pressure or headache.  Needs no refills of medication today. Vitamin D  deficiency On prescription strength Vitamin D .  No nausea, vomiting or muscle  weakness.  Needs a refill today. Morbid obesity (HCC)  BMI 60.0-69.9, adult (HCC)    Diet: Odelia is currently in the action stage of change. As such, her goal is to continue with weight loss efforts and has agreed to the Category 4 Plan.   Exercise:  For substantial health benefits, adults should do at least 150 minutes (2 hours and 30 minutes) a week of moderate-intensity, or 75 minutes (1 hour and 15 minutes) a week of vigorous-intensity aerobic physical activity, or an equivalent combination of moderate- and vigorous-intensity aerobic activity. Aerobic activity should be performed in episodes of at least 10 minutes, and preferably, it should be spread throughout the week.  Behavior Modification:  We discussed the following Behavioral Modification Strategies today: increasing lean protein intake, decreasing simple carbohydrates, increasing vegetables, meal planning and cooking strategies, and keeping healthy foods in the home.   Return in about 4 weeks (around 07/14/2024).   She was informed of the importance of frequent follow up visits to maximize her success with intensive lifestyle modifications for her multiple health conditions.  Attestation Statements:   Reviewed by clinician on day of visit: allergies, medications, problem list, medical history, surgical history, family history, social history, and previous encounter notes.     Adelita Cho, MD

## 2024-06-28 NOTE — Assessment & Plan Note (Signed)
 On prescription strength Vitamin D .  No nausea, vomiting or muscle weakness.  Needs a refill today.

## 2024-06-28 NOTE — Assessment & Plan Note (Signed)
 Blood pressure well controlled today.  No chest pain, chest pressure or headache.  Needs no refills of medication today.

## 2024-07-14 ENCOUNTER — Ambulatory Visit (INDEPENDENT_AMBULATORY_CARE_PROVIDER_SITE_OTHER): Admitting: Family Medicine

## 2024-07-14 ENCOUNTER — Encounter (INDEPENDENT_AMBULATORY_CARE_PROVIDER_SITE_OTHER): Payer: Self-pay | Admitting: Family Medicine

## 2024-07-14 VITALS — BP 125/79 | HR 91 | Temp 97.8°F | Ht 68.0 in | Wt >= 6400 oz

## 2024-07-14 DIAGNOSIS — E7849 Other hyperlipidemia: Secondary | ICD-10-CM | POA: Diagnosis not present

## 2024-07-14 DIAGNOSIS — E559 Vitamin D deficiency, unspecified: Secondary | ICD-10-CM

## 2024-07-14 DIAGNOSIS — Z6841 Body Mass Index (BMI) 40.0 and over, adult: Secondary | ICD-10-CM | POA: Diagnosis not present

## 2024-07-14 MED ORDER — ROSUVASTATIN CALCIUM 20 MG PO TABS: 20.0000 mg | ORAL_TABLET | Freq: Every day | ORAL | 0 refills | Status: DC

## 2024-07-14 MED ORDER — VITAMIN D (ERGOCALCIFEROL) 1.25 MG (50000 UNIT) PO CAPS: 50000.0000 [IU] | ORAL_CAPSULE | ORAL | 0 refills | Status: DC

## 2024-07-14 NOTE — Progress Notes (Signed)
 SUBJECTIVE:  Chief Complaint: Obesity  Interim History: Patient had a good and enjoyable last month.  She feels frustrated with weather as it went from hot to rain and overcast.  She has been hanging out with her great niece a majority of time. She has stuck to the plan very consistently but hasn't been exercising as much as she was previously.  She is taking food to work with her and making time to eat. She is feeling very burnt out and is surprising herself that she isn't falling into old habits.  Sharon Robertson is here to discuss her progress with her obesity treatment plan. She is on the Category 4 Plan and states she is following her eating plan approximately 95 % of the time. She states she is walking 30 minutes 3 times per week.   OBJECTIVE: Visit Diagnoses: Problem List Items Addressed This Visit       Other   Morbid obesity (HCC)   Vitamin D  deficiency - Primary   Relevant Medications   Vitamin D , Ergocalciferol , (DRISDOL ) 1.25 MG (50000 UNIT) CAPS capsule   Other hyperlipidemia   Relevant Medications   rosuvastatin  (CRESTOR ) 20 MG tablet   Other Visit Diagnoses       BMI 60.0-69.9, adult (HCC)           Vitals Temp: 97.8 F (36.6 C) BP: 125/79 Pulse Rate: 91 SpO2: 97 %   Anthropometric Measurements Height: 5' 8 (1.727 m) Weight: (!) 402 lb (182.3 kg) BMI (Calculated): 61.14 Weight at Last Visit: 399 lb Weight Lost Since Last Visit: 0 Weight Gained Since Last Visit: 3 Starting Weight: 410 Total Weight Loss (lbs): 8 lb (3.629 kg)   Body Composition  Body Fat %: 62.6 % Fat Mass (lbs): 251.8 lbs Muscle Mass (lbs): 142.8 lbs Visceral Fat Rating : 28   Other Clinical Data Fasting: yes Labs: no Today's Visit #: 38 Starting Date: 02/09/20 Comments: Cat 4     ASSESSMENT AND PLAN: Assessment & Plan Vitamin D  deficiency Refill prescription strength vitamin D  supplementation today.  Patient does report improvement in fatigue as well as mood.  No change  in dosage at this time. Other hyperlipidemia Continue Crestor  20 mg daily refill sent to pharmacy today.  Will need repeat labs in September to assess response to higher dose Crestor .  If LDL continues to remain significantly elevated patient will need an appointment at the lipid clinic for further evaluation. Morbid obesity (HCC)  BMI 60.0-69.9, adult (HCC)    Diet: Sharon Robertson is currently in the action stage of change. As such, her goal is to continue with weight loss efforts and has agreed to the Category 4 Plan.   Exercise:  For substantial health benefits, adults should do at least 150 minutes (2 hours and 30 minutes) a week of moderate-intensity, or 75 minutes (1 hour and 15 minutes) a week of vigorous-intensity aerobic physical activity, or an equivalent combination of moderate- and vigorous-intensity aerobic activity. Aerobic activity should be performed in episodes of at least 10 minutes, and preferably, it should be spread throughout the week.  Behavior Modification:  We discussed the following Behavioral Modification Strategies today: increasing lean protein intake, decreasing simple carbohydrates, increasing vegetables, meal planning and cooking strategies, and keeping healthy foods in the home.   Return in about 4 weeks (around 08/11/2024).   She was informed of the importance of frequent follow up visits to maximize her success with intensive lifestyle modifications for her multiple health conditions.  Attestation Statements:  Reviewed by clinician on day of visit: allergies, medications, problem list, medical history, surgical history, family history, social history, and previous encounter notes.     Sharon Cho, MD

## 2024-07-24 NOTE — Assessment & Plan Note (Signed)
 Refill prescription strength vitamin D  supplementation today.  Patient does report improvement in fatigue as well as mood.  No change in dosage at this time.

## 2024-07-24 NOTE — Assessment & Plan Note (Signed)
 Continue Crestor  20 mg daily refill sent to pharmacy today.  Will need repeat labs in September to assess response to higher dose Crestor .  If LDL continues to remain significantly elevated patient will need an appointment at the lipid clinic for further evaluation.

## 2024-08-08 ENCOUNTER — Other Ambulatory Visit: Payer: Self-pay | Admitting: Family Medicine

## 2024-08-08 DIAGNOSIS — Z1231 Encounter for screening mammogram for malignant neoplasm of breast: Secondary | ICD-10-CM

## 2024-08-18 ENCOUNTER — Ambulatory Visit (INDEPENDENT_AMBULATORY_CARE_PROVIDER_SITE_OTHER): Admitting: Family Medicine

## 2024-08-18 ENCOUNTER — Encounter (INDEPENDENT_AMBULATORY_CARE_PROVIDER_SITE_OTHER): Payer: Self-pay | Admitting: Family Medicine

## 2024-08-18 ENCOUNTER — Ambulatory Visit
Admission: RE | Admit: 2024-08-18 | Discharge: 2024-08-18 | Disposition: A | Discharge status: Home / Self Care | Source: Ambulatory Visit

## 2024-08-18 VITALS — BP 112/78 | HR 95 | Temp 97.7°F | Ht 68.0 in | Wt 395.0 lb

## 2024-08-18 DIAGNOSIS — I10 Essential (primary) hypertension: Secondary | ICD-10-CM

## 2024-08-18 DIAGNOSIS — Z6841 Body Mass Index (BMI) 40.0 and over, adult: Secondary | ICD-10-CM

## 2024-08-18 DIAGNOSIS — Z1231 Encounter for screening mammogram for malignant neoplasm of breast: Secondary | ICD-10-CM | POA: Diagnosis not present

## 2024-08-18 DIAGNOSIS — F419 Anxiety disorder, unspecified: Secondary | ICD-10-CM

## 2024-08-18 DIAGNOSIS — F32A Depression, unspecified: Secondary | ICD-10-CM

## 2024-08-18 MED ORDER — CHLORTHALIDONE 25 MG PO TABS: 25.0000 mg | ORAL_TABLET | Freq: Every day | ORAL | 0 refills | Status: DC

## 2024-08-18 MED ORDER — SERTRALINE HCL 50 MG PO TABS: 50.0000 mg | ORAL_TABLET | Freq: Every day | ORAL | 0 refills | Status: DC

## 2024-08-18 MED ORDER — LISINOPRIL 5 MG PO TABS: 5.0000 mg | ORAL_TABLET | Freq: Every day | ORAL | 0 refills | Status: DC

## 2024-08-18 NOTE — Progress Notes (Signed)
 SUBJECTIVE:  Chief Complaint: Obesity  Interim History: Patient has been more active since last appointment and she has been able to get out and do more things.  Over all she is feeling better as she is learning to leave work at work. Over the next few weeks she is going to a conference- by herself.  Continuing to do the same things she has been doing to help with her mental and physical health.  She has gotten back into a schedule of meal prepping and planning.  She is taking time during the day to ensure she is getting her meals in.  Biggest issue has been making sure she is making time to eat.  She is sleeping better over as well. She voices she just needs to continue go the way she is going as she is right now.  She wants to continue to focus and take care of herself.  Sharon Robertson is here to discuss her progress with her obesity treatment plan. She is on the Category 4 Plan and states she is following her eating plan approximately 90 % of the time. She states she is walking 15-20 minutes 3 times per week.   OBJECTIVE: Visit Diagnoses: Problem List Items Addressed This Visit       Cardiovascular and Mediastinum   Essential hypertension   Relevant Medications   chlorthalidone  (HYGROTON ) 25 MG tablet   lisinopril  (ZESTRIL ) 5 MG tablet     Other   Anxiety and depression   Relevant Medications   sertraline  (ZOLOFT ) 50 MG tablet    Vitals Temp: 97.7 F (36.5 C) BP: 112/78 Pulse Rate: 95 SpO2: 98 %   Anthropometric Measurements Height: 5' 8 (1.727 m) Weight: (!) 395 lb (179.2 kg) BMI (Calculated): 60.07 Weight at Last Visit: 402 lb Weight Lost Since Last Visit: 7 Weight Gained Since Last Visit: 0 Starting Weight: 410 lb Total Weight Loss (lbs): 15 lb (6.804 kg)   Body Composition  Body Fat %: 60.2 % Fat Mass (lbs): 238 lbs Muscle Mass (lbs): 149.4 lbs Visceral Fat Rating : 27   Other Clinical Data Today's Visit #: 34 Starting Date: 02/09/20 Comments: Cat  4     ASSESSMENT AND PLAN: Assessment & Plan Essential hypertension Blood pressure was well controlled today on current medications and dosages.  No chest pain, chest pressure or headache.  Needs refills of lisinopril  and chlorthalidone  today.   Anxiety and depression Doing well with maintaining boundaries and on current dose of sertraline  feels symptoms are well controlled.  Needs refill today.  No suicidal or homicidal ideation.  Will continue current dose and continue to check in at upcoming appointments. BMI 60.0-69.9, adult (HCC)  Morbid obesity (HCC)    Diet: Sharon Robertson is currently in the action stage of change. As such, her goal is to continue with weight loss efforts and has agreed to the Category 4 Plan.   Exercise:  For substantial health benefits, adults should do at least 150 minutes (2 hours and 30 minutes) a week of moderate-intensity, or 75 minutes (1 hour and 15 minutes) a week of vigorous-intensity aerobic physical activity, or an equivalent combination of moderate- and vigorous-intensity aerobic activity. Aerobic activity should be performed in episodes of at least 10 minutes, and preferably, it should be spread throughout the week.  Behavior Modification:  We discussed the following Behavioral Modification Strategies today: increasing lean protein intake, decreasing simple carbohydrates, increasing vegetables, meal planning and cooking strategies, and emotional eating strategies .   Return in about  5 weeks (around 09/22/2024).   She was informed of the importance of frequent follow up visits to maximize her success with intensive lifestyle modifications for her multiple health conditions.  Attestation Statements:   Reviewed by clinician on day of visit: allergies, medications, problem list, medical history, surgical history, family history, social history, and previous encounter notes.     Sharon Cho, MD

## 2024-08-25 NOTE — Assessment & Plan Note (Signed)
 Doing well with maintaining boundaries and on current dose of sertraline  feels symptoms are well controlled.  Needs refill today.  No suicidal or homicidal ideation.  Will continue current dose and continue to check in at upcoming appointments.

## 2024-08-25 NOTE — Assessment & Plan Note (Signed)
 Blood pressure was well controlled today on current medications and dosages.  No chest pain, chest pressure or headache.  Needs refills of lisinopril  and chlorthalidone  today.

## 2024-09-22 ENCOUNTER — Ambulatory Visit (INDEPENDENT_AMBULATORY_CARE_PROVIDER_SITE_OTHER): Admitting: Family Medicine

## 2024-09-22 VITALS — BP 136/81 | HR 88 | Temp 97.6°F | Ht 68.0 in | Wt 398.0 lb

## 2024-09-22 DIAGNOSIS — E559 Vitamin D deficiency, unspecified: Secondary | ICD-10-CM

## 2024-09-22 DIAGNOSIS — E785 Hyperlipidemia, unspecified: Secondary | ICD-10-CM | POA: Diagnosis not present

## 2024-09-22 DIAGNOSIS — R7303 Prediabetes: Secondary | ICD-10-CM

## 2024-09-22 DIAGNOSIS — Z6841 Body Mass Index (BMI) 40.0 and over, adult: Secondary | ICD-10-CM

## 2024-09-22 MED ORDER — PHENTERMINE HCL 8 MG PO TABS: ORAL_TABLET | ORAL | 0 refills | Status: DC

## 2024-09-22 MED ORDER — TOPIRAMATE 25 MG PO TABS: ORAL_TABLET | ORAL | 0 refills | Status: DC

## 2024-09-22 NOTE — Progress Notes (Signed)
 SUBJECTIVE:  Chief Complaint: Obesity  Interim History: Patient took some time off since our last appointment.  She has been able to incorporate more walking and is adding more inclines.  The last week she hasn't been drinking the way she knows she needs to.  She is able to get all the food in but her fluid intake is not where she knows it needs to be.  [employer] she does not have anything coming up for herself.  Now that the weather is nicer she is planning to go the mountains and vegetate for a few days.   Sharon Robertson is here to discuss her progress with her obesity treatment plan. She is on the Category 4 Plan and states she is following her eating plan approximately 95 % of the time. She states she is walking 30 minutes 4 times per week.   OBJECTIVE: Visit Diagnoses: Problem List Items Addressed This Visit   None   Vitals Temp: 97.6 F (36.4 C) BP: 136/81 Pulse Rate: 88 SpO2: 95 %   Anthropometric Measurements Height: 5' 8 (1.727 m) Weight: (!) 398 lb (180.5 kg) BMI (Calculated): 60.53 Weight at Last Visit: 395 lb Weight Lost Since Last Visit: 0 Weight Gained Since Last Visit: 3 Starting Weight: 410 lb Total Weight Loss (lbs): 12 lb (5.443 kg)   Body Composition  Body Fat %: 61.6 % Fat Mass (lbs): 245.6 lbs Muscle Mass (lbs): 145.6 lbs Visceral Fat Rating : 27   Other Clinical Data Fasting: yes Labs: yes Today's Visit #: 40 Starting Date: 02/09/20 Comments: Cat 4     ASSESSMENT AND PLAN: Assessment & Plan Vitamin D  deficiency Needs repeat vitamin D  level today.  Will discuss results at next appointment in change treatment/management if necessary. Prediabetes Patient has been working on dietary changes and increasing protein intake while limiting simple carbohydrates.  Will repeat CMP, A1c, and insulin  level today. Dyslipidemia Previously elevated LDL with decreased HDL.  Needs reevaluation with fasting lipid panel today.  Plan to discuss and risk stratify  patient with current lab results at next appointment. Morbid obesity (HCC) Medication options were discussed extensively with patient today.  Given availability, cost, insurance coverage and other medical comorbidities the decision was reached to start medications Lomaira  and Topiramate.  These medications will be used as a substitute for brand name Qsymia in doses that are synanomous.  Patient understands this is an off label usage.  We discussed the titration schedule with the goal of 5% weight loss at 3 months at a treatment dose.  The first two weeks will be a starting dose of 25mg  of Topiramate and 4mg  of Lomaira .  After two weeks the patient will increase to 50mg  of Topiramate and 8mg  of Lomaira  and will stay on this dose until the next appointment.  Controlled substance contract was discussed and signed today.  Prescriptions sent in.   Patient has been significantly contemplating bariatric surgery.  At this time I think she would be an ideal candidate given her adherence to dietary changes and lack of change in terms of weight with lifestyle modifications alone.  Given insurance coverage aggressive antiobesity medications are not affordable.  Will refer to bariatric surgery and follow-up at next appointment as to whether or not patient is going to proceed with bariatric surgery program. BMI 60.0-69.9, adult (HCC)    Diet: Sharon Robertson is currently in the action stage of change. As such, her goal is to continue with weight loss efforts and has agreed to the Category 4  Plan.   Exercise:  For substantial health benefits, adults should do at least 150 minutes (2 hours and 30 minutes) a week of moderate-intensity, or 75 minutes (1 hour and 15 minutes) a week of vigorous-intensity aerobic physical activity, or an equivalent combination of moderate- and vigorous-intensity aerobic activity. Aerobic activity should be performed in episodes of at least 10 minutes, and preferably, it should be spread throughout  the week.  Behavior Modification:  We discussed the following Behavioral Modification Strategies today: increasing lean protein intake, decreasing simple carbohydrates, increasing vegetables, meal planning and cooking strategies, and planning for success. We discussed various medication options to help Sharon Robertson with her weight loss efforts and we both agreed to start phentermine  and topiramate as above.  No follow-ups on file.   She was informed of the importance of frequent follow up visits to maximize her success with intensive lifestyle modifications for her multiple health conditions.  Attestation Statements:   Reviewed by clinician on day of visit: allergies, medications, problem list, medical history, surgical history, family history, social history, and previous encounter notes.   Sharon Cho, MD

## 2024-09-22 NOTE — Assessment & Plan Note (Addendum)
 Medication options were discussed extensively with patient today.  Given availability, cost, insurance coverage and other medical comorbidities the decision was reached to start medications Lomaira  and Topiramate.  These medications will be used as a substitute for brand name Qsymia in doses that are synanomous.  Patient understands this is an off label usage.  We discussed the titration schedule with the goal of 5% weight loss at 3 months at a treatment dose.  The first two weeks will be a starting dose of 25mg  of Topiramate and 4mg  of Lomaira .  After two weeks the patient will increase to 50mg  of Topiramate and 8mg  of Lomaira  and will stay on this dose until the next appointment.  Controlled substance contract was discussed and signed today.  Prescriptions sent in.   Patient has been significantly contemplating bariatric surgery.  At this time I think she would be an ideal candidate given her adherence to dietary changes and lack of change in terms of weight with lifestyle modifications alone.  Given insurance coverage aggressive antiobesity medications are not affordable.  Will refer to bariatric surgery and follow-up at next appointment as to whether or not patient is going to proceed with bariatric surgery program.

## 2024-09-23 LAB — COMPREHENSIVE METABOLIC PANEL WITH GFR
ALT: 23 IU/L (ref 0–32)
AST: 22 IU/L (ref 0–40)
Albumin: 4 g/dL (ref 3.8–4.9)
Alkaline Phosphatase: 103 IU/L (ref 49–135)
BUN/Creatinine Ratio: 28 — ABNORMAL HIGH (ref 9–23)
BUN: 18 mg/dL (ref 6–24)
Bilirubin Total: 0.3 mg/dL (ref 0.0–1.2)
CO2: 24 mmol/L (ref 20–29)
Calcium: 9.5 mg/dL (ref 8.7–10.2)
Chloride: 101 mmol/L (ref 96–106)
Creatinine, Ser: 0.64 mg/dL (ref 0.57–1.00)
Globulin, Total: 3 g/dL (ref 1.5–4.5)
Glucose: 101 mg/dL — ABNORMAL HIGH (ref 70–99)
Potassium: 4.2 mmol/L (ref 3.5–5.2)
Sodium: 141 mmol/L (ref 134–144)
Total Protein: 7 g/dL (ref 6.0–8.5)
eGFR: 106 mL/min/1.73 (ref >59)

## 2024-09-23 LAB — INSULIN, RANDOM: INSULIN: 16.3 u[IU]/mL (ref 2.6–24.9)

## 2024-09-23 LAB — LIPID PANEL WITH LDL/HDL RATIO
Cholesterol, Total: 184 mg/dL (ref 100–199)
HDL: 50 mg/dL (ref >39)
LDL Chol Calc (NIH): 114 mg/dL — ABNORMAL HIGH (ref 0–99)
LDL/HDL Ratio: 2.3 ratio (ref 0.0–3.2)
Triglycerides: 113 mg/dL (ref 0–149)
VLDL Cholesterol Cal: 20 mg/dL (ref 5–40)

## 2024-09-23 LAB — HEMOGLOBIN A1C
Est. average glucose Bld gHb Est-mCnc: 126 mg/dL
Hgb A1c MFr Bld: 6 % — ABNORMAL HIGH (ref 4.8–5.6)

## 2024-09-23 LAB — VITAMIN D 25 HYDROXY (VIT D DEFICIENCY, FRACTURES): Vit D, 25-Hydroxy: 35.2 ng/mL (ref 30.0–100.0)

## 2024-09-30 NOTE — Assessment & Plan Note (Signed)
 Needs repeat vitamin D  level today.  Will discuss results at next appointment in change treatment/management if necessary.

## 2024-09-30 NOTE — Assessment & Plan Note (Signed)
 Previously elevated LDL with decreased HDL.  Needs reevaluation with fasting lipid panel today.  Plan to discuss and risk stratify patient with current lab results at next appointment.

## 2024-09-30 NOTE — Assessment & Plan Note (Signed)
 Patient has been working on dietary changes and increasing protein intake while limiting simple carbohydrates.  Will repeat CMP, A1c, and insulin  level today.

## 2024-10-04 ENCOUNTER — Other Ambulatory Visit (INDEPENDENT_AMBULATORY_CARE_PROVIDER_SITE_OTHER): Payer: Self-pay | Admitting: Family Medicine

## 2024-10-04 DIAGNOSIS — E559 Vitamin D deficiency, unspecified: Secondary | ICD-10-CM

## 2024-11-03 ENCOUNTER — Encounter (INDEPENDENT_AMBULATORY_CARE_PROVIDER_SITE_OTHER): Payer: Self-pay | Admitting: Family Medicine

## 2024-11-03 ENCOUNTER — Telehealth (INDEPENDENT_AMBULATORY_CARE_PROVIDER_SITE_OTHER): Payer: Self-pay | Admitting: Family Medicine

## 2024-11-03 DIAGNOSIS — I10 Essential (primary) hypertension: Secondary | ICD-10-CM

## 2024-11-03 DIAGNOSIS — E7849 Other hyperlipidemia: Secondary | ICD-10-CM

## 2024-11-03 DIAGNOSIS — E559 Vitamin D deficiency, unspecified: Secondary | ICD-10-CM | POA: Diagnosis not present

## 2024-11-03 DIAGNOSIS — F419 Anxiety disorder, unspecified: Secondary | ICD-10-CM | POA: Diagnosis not present

## 2024-11-03 DIAGNOSIS — Z6841 Body Mass Index (BMI) 40.0 and over, adult: Secondary | ICD-10-CM | POA: Diagnosis not present

## 2024-11-03 DIAGNOSIS — F32A Depression, unspecified: Secondary | ICD-10-CM | POA: Diagnosis not present

## 2024-11-03 MED ORDER — ROSUVASTATIN CALCIUM 20 MG PO TABS: 20.0000 mg | ORAL_TABLET | Freq: Every day | ORAL | 0 refills | Status: AC

## 2024-11-03 MED ORDER — LISINOPRIL 5 MG PO TABS: 5.0000 mg | ORAL_TABLET | Freq: Every day | ORAL | 0 refills | Status: AC

## 2024-11-03 MED ORDER — VITAMIN D (ERGOCALCIFEROL) 1.25 MG (50000 UNIT) PO CAPS: 50000.0000 [IU] | ORAL_CAPSULE | ORAL | 0 refills | Status: AC

## 2024-11-03 MED ORDER — SERTRALINE HCL 50 MG PO TABS: 50.0000 mg | ORAL_TABLET | Freq: Every day | ORAL | 0 refills | Status: AC

## 2024-11-03 MED ORDER — TOPIRAMATE 50 MG PO TABS: 75.0000 mg | ORAL_TABLET | Freq: Every day | ORAL | 0 refills | Status: DC

## 2024-11-03 MED ORDER — PHENTERMINE HCL 8 MG PO TABS: 12.0000 mg | ORAL_TABLET | Freq: Every day | ORAL | 0 refills | Status: DC

## 2024-11-03 MED ORDER — CHLORTHALIDONE 25 MG PO TABS: 25.0000 mg | ORAL_TABLET | Freq: Every day | ORAL | 0 refills | Status: AC

## 2024-11-03 NOTE — Progress Notes (Unsigned)
 TeleHealth Visit:   Sharon Robertson has verbally consented to this audiovisual technology visit. The provider is located at home, the patient is located at the [facility] office. The participants in this visit include the listed provider and patient . The visit was conducted today via [employer].   Chief Complaint: OBESITY Sharon Robertson is here to discuss her progress with her obesity treatment plan along with follow-up of her obesity related diagnoses. Sharon Robertson is on the Category 4 Plan and states she is following her eating plan approximately 95% of the time. Sharon Robertson states she is walking 45 minutes 3 times per week.  No data recorded Anthropometric Measurements Height: 5' 8 (1.727 m) Weight at Last Visit: 398 lb Starting Weight: 410 lb   No data recorded Other Clinical Data Today's Visit #: 41 VV Starting Date: 02/09/20 Comments: Cat 4    Reported Weight: NWA  Subjective:  Patient stayed consistently mindful and in control of her food intake over the last 4-5 weeks.  She has also been able to stay consistent with walking and has been not as physically limited in her walking.  She was not always able to get all the food in; 2 days over a two week span she wasn't able to get all the food in.  There are no diagnoses linked to this encounter. Assessment/Plan:   Assessment & Plan Essential hypertension Blood pressure previously controlled on chlorthalidone  and lisinopril .  No change in dosage needed today but needs refill.  Will continue current treatment and follow-up on blood pressure when patient is in the office. Other hyperlipidemia No side effects on current statin dose.  Needs refill today of 20 mg dose.  Refill sent to pharmacy. Anxiety and depression Patient still noticing improvement in symptoms frequency and intensity on current dosage of Zoloft .  Continue current dose of Zoloft  refill sent to pharmacy. Vitamin D  deficiency Refill of  prescription strength vitamin D  sent to pharmacy.  No nausea, vomiting, or muscle weakness reported. Morbid obesity (HCC) Patient reports still having some food desires and cravings on current dosages of phentermine  and topiramate .  Needs refills of these today and is interested in increasing dose.  Will increase phentermine  to 12 mg and 75 mg of topiramate .  PDMP checked with no concerns.  Refill sent into pharmacy. Sharon Robertson is currently in the action stage of change. As such, her goal is to continue with weight loss efforts and has agreed to the Category 4 Plan.   Exercise goals: For substantial health benefits, adults should do at least 150 minutes (2 hours and 30 minutes) a week of moderate-intensity, or 75 minutes (1 hour and 15 minutes) a week of vigorous-intensity aerobic physical activity, or an equivalent combination of moderate- and vigorous-intensity aerobic activity. Aerobic activity should be performed in episodes of at least 10 minutes, and preferably, it should be spread throughout the week.  Behavioral modification strategies: increasing lean protein intake, decreasing simple carbohydrates, increasing vegetables, meal planning and cooking strategies, and holiday eating strategies .  Sharon Robertson has agreed to follow-up with our clinic in 5 weeks. She was informed of the importance of frequent follow-up visits to maximize her success with intensive lifestyle modifications for her multiple health conditions.   Objective:   VITALS: Per patient if applicable, see vitals. GENERAL: Alert and in no acute distress. CARDIOPULMONARY: No increased WOB. Speaking in clear sentences.  PSYCH: Pleasant and cooperative. Speech normal rate and rhythm. Affect is appropriate. Insight and judgement are appropriate.  Attention is focused, linear, and appropriate.  NEURO: Oriented as arrived to appointment on time with no prompting.   Lab Results  Component Value Date   CREATININE 0.64 09/22/2024   BUN 18  09/22/2024   NA 141 09/22/2024   K 4.2 09/22/2024   CL 101 09/22/2024   CO2 24 09/22/2024   Lab Results  Component Value Date   ALT 23 09/22/2024   AST 22 09/22/2024   ALKPHOS 103 09/22/2024   BILITOT 0.3 09/22/2024   Lab Results  Component Value Date   HGBA1C 6.0 (H) 09/22/2024   HGBA1C 5.9 (H) 04/21/2024   HGBA1C 6.1 (H) 11/05/2023   HGBA1C 5.9 (H) 05/14/2023   HGBA1C 5.7 (H) 01/24/2021   Lab Results  Component Value Date   INSULIN  16.3 09/22/2024   INSULIN  15.2 04/21/2024   INSULIN  10.5 11/05/2023   INSULIN  11.1 05/14/2023   INSULIN  8.5 01/24/2021   Lab Results  Component Value Date   TSH 2.210 05/14/2023   Lab Results  Component Value Date   CHOL 184 09/22/2024   HDL 50 09/22/2024   LDLCALC 114 (H) 09/22/2024   TRIG 113 09/22/2024   CHOLHDL 3.9 01/24/2021   Lab Results  Component Value Date   VD25OH 35.2 09/22/2024   VD25OH 25.9 (L) 04/21/2024   VD25OH 31.1 11/05/2023   Lab Results  Component Value Date   WBC 7.6 05/14/2023   HGB 12.5 05/14/2023   HCT 39.8 05/14/2023   MCV 80 05/14/2023   PLT 461 (H) 05/14/2023   Lab Results  Component Value Date   IRON 93 02/09/2020   TIBC 363 02/09/2020   FERRITIN 42 02/09/2020    Attestation Statements:   Reviewed by clinician on day of visit: allergies, medications, problem list, medical history, surgical history, family history, social history, and previous encounter notes.    Adelita Cho, MD

## 2024-11-13 NOTE — Assessment & Plan Note (Signed)
 No side effects on current statin dose.  Needs refill today of 20 mg dose.  Refill sent to pharmacy.

## 2024-11-13 NOTE — Assessment & Plan Note (Signed)
 Blood pressure previously controlled on chlorthalidone  and lisinopril .  No change in dosage needed today but needs refill.  Will continue current treatment and follow-up on blood pressure when patient is in the office.

## 2024-11-13 NOTE — Assessment & Plan Note (Signed)
 Patient still noticing improvement in symptoms frequency and intensity on current dosage of Zoloft .  Continue current dose of Zoloft  refill sent to pharmacy.

## 2024-11-13 NOTE — Assessment & Plan Note (Signed)
 Patient reports still having some food desires and cravings on current dosages of phentermine  and topiramate .  Needs refills of these today and is interested in increasing dose.

## 2024-11-13 NOTE — Assessment & Plan Note (Signed)
 Refill of prescription strength vitamin D  sent to pharmacy.  No nausea, vomiting, or muscle weakness reported.

## 2024-11-30 ENCOUNTER — Other Ambulatory Visit (INDEPENDENT_AMBULATORY_CARE_PROVIDER_SITE_OTHER): Payer: Self-pay | Admitting: Family Medicine

## 2024-12-08 ENCOUNTER — Ambulatory Visit (INDEPENDENT_AMBULATORY_CARE_PROVIDER_SITE_OTHER): Payer: Self-pay | Admitting: Family Medicine

## 2025-01-05 ENCOUNTER — Ambulatory Visit (INDEPENDENT_AMBULATORY_CARE_PROVIDER_SITE_OTHER): Admitting: Family Medicine

## 2025-01-05 ENCOUNTER — Encounter: Payer: Self-pay | Admitting: Family Medicine

## 2025-01-05 ENCOUNTER — Ambulatory Visit: Admitting: Family Medicine

## 2025-01-05 VITALS — BP 136/84 | HR 80 | Temp 97.8°F | Ht 69.0 in | Wt >= 6400 oz

## 2025-01-05 DIAGNOSIS — I10 Essential (primary) hypertension: Secondary | ICD-10-CM

## 2025-01-05 DIAGNOSIS — E7849 Other hyperlipidemia: Secondary | ICD-10-CM

## 2025-01-05 DIAGNOSIS — Z1159 Encounter for screening for other viral diseases: Secondary | ICD-10-CM

## 2025-01-05 DIAGNOSIS — Z1211 Encounter for screening for malignant neoplasm of colon: Secondary | ICD-10-CM

## 2025-01-05 DIAGNOSIS — E559 Vitamin D deficiency, unspecified: Secondary | ICD-10-CM

## 2025-01-05 DIAGNOSIS — Z114 Encounter for screening for human immunodeficiency virus [HIV]: Secondary | ICD-10-CM

## 2025-01-05 DIAGNOSIS — R7303 Prediabetes: Secondary | ICD-10-CM

## 2025-01-05 DIAGNOSIS — Z7689 Persons encountering health services in other specified circumstances: Secondary | ICD-10-CM

## 2025-01-05 LAB — VITAMIN D 25 HYDROXY (VIT D DEFICIENCY, FRACTURES): VITD: 25.48 ng/mL — ABNORMAL LOW (ref 30.00–100.00)

## 2025-01-05 LAB — COMPREHENSIVE METABOLIC PANEL WITH GFR
ALT: 25 U/L (ref 3–35)
AST: 24 U/L (ref 5–37)
Albumin: 4 g/dL (ref 3.5–5.2)
Alkaline Phosphatase: 87 U/L (ref 39–117)
BUN: 16 mg/dL (ref 6–23)
CO2: 30 meq/L (ref 19–32)
Calcium: 9.3 mg/dL (ref 8.4–10.5)
Chloride: 102 meq/L (ref 96–112)
Creatinine, Ser: 0.63 mg/dL (ref 0.40–1.20)
GFR: 101.12 mL/min
Glucose, Bld: 89 mg/dL (ref 70–99)
Potassium: 4.5 meq/L (ref 3.5–5.1)
Sodium: 140 meq/L (ref 135–145)
Total Bilirubin: 0.4 mg/dL (ref 0.2–1.2)
Total Protein: 7.3 g/dL (ref 6.0–8.3)

## 2025-01-05 LAB — LIPID PANEL
Cholesterol: 215 mg/dL — ABNORMAL HIGH (ref 28–200)
HDL: 54.9 mg/dL
LDL Cholesterol: 133 mg/dL — ABNORMAL HIGH (ref 10–99)
NonHDL: 159.62
Total CHOL/HDL Ratio: 4
Triglycerides: 134 mg/dL (ref 10.0–149.0)
VLDL: 26.8 mg/dL (ref 0.0–40.0)

## 2025-01-05 LAB — HEMOGLOBIN A1C: Hgb A1c MFr Bld: 6.3 % (ref 4.6–6.5)

## 2025-01-05 NOTE — Assessment & Plan Note (Signed)
 Discontinuing medications Phentermine  and Topiramate  at patients request due to not having successful weight loss.

## 2025-01-05 NOTE — Assessment & Plan Note (Signed)
 Blood pressure is stable. Continue Chlorthalidone  25mg  daily and Lisinopril  5mg  daily. Ordered CMP.

## 2025-01-05 NOTE — Progress Notes (Signed)
 "  New Patient Office Visit  Subjective   Patient ID: Sharon Robertson, female    DOB: 02-19-1971  Age: 54 y.o. MRN: [id number]  CC:  Chief Complaint  Patient presents with   Establish Care    HPI Sharon Robertson presents to establish care with new provider.  Patients previous primary care provider: None   Specialist: [facility] with Dr. Adelita Cho   HTN: Chronic. Patient is prescribed Chlorthalidone  25mg  daily and Lisinopril  5mg  daily. She reports she monitors her BP at home, usually ranges 110-70s. Denies CP, HA, dizziness, lightheadedness, or lower extremity edema. Has SHOB on exertion, like walking on an incline.  BP Readings from Last 3 Encounters:  01/05/25 136/84  09/22/24 136/81  08/18/24 112/78    Hyperlipidemia: Chronic. Patient is taking Rosuvastatin  20mg  daily. Denies muscle/joint pain; abd pain, nausea, or vomiting.  Lab Results  Component Value Date   CHOL 184 09/22/2024   HDL 50 09/22/2024   LDLCALC 114 (H) 09/22/2024   TRIG 113 09/22/2024   CHOLHDL 3.9 01/24/2021    Anxiety: Chronic. Patient is Sertraline  50mg  daily. Effective.   Vitamin D  deficiency: Taking prescription strength supplement. Last level was 35.2 back on 09/22/2024.   Prediabetes: Last A1c was 6.0. Not taking medication.  Outpatient Encounter Medications as of 01/05/2025  Medication Sig   chlorthalidone  (HYGROTON ) 25 MG tablet Take 1 tablet (25 mg total) by mouth daily.   lisinopril  (ZESTRIL ) 5 MG tablet Take 1 tablet (5 mg total) by mouth daily.   rosuvastatin  (CRESTOR ) 20 MG tablet Take 1 tablet (20 mg total) by mouth daily.   sertraline  (ZOLOFT ) 50 MG tablet Take 1 tablet (50 mg total) by mouth daily.   Vitamin D , Ergocalciferol , (DRISDOL ) 1.25 MG (50000 UNIT) CAPS capsule Take 1 capsule (50,000 Units total) by mouth every 7 (seven) days.   [DISCONTINUED] Phentermine  HCl (LOMAIRA ) 8 MG TABS Take 1.5 tablets (12 mg total) by mouth daily.    [DISCONTINUED] topiramate  (TOPAMAX ) 50 MG tablet Take 1.5 tablets (75 mg total) by mouth daily.   No facility-administered encounter medications on file as of 01/05/2025.    Past Medical History:  Diagnosis Date   Anxiety    Asthma    Back pain    Bilateral swelling of feet and ankles    Hyperlipemia    Hypertension    Joint pain    Prediabetes    Sleep apnea    SOB (shortness of breath)    Vitamin D  deficiency     Past Surgical History:  Procedure Laterality Date   ADENOIDECTOMY     childhood    Family History  Problem Relation Age of Onset   Diabetes Mother    Heart disease Mother    Hyperlipidemia Mother    Hypertension Mother    Obesity Mother    Cancer Father        prostate   Diabetes Father    Hyperlipidemia Father    Hypertension Father    Obesity Father    Sleep apnea Father    Arthritis Father    Heart disease Maternal Grandmother    Diabetes Maternal Grandmother    Heart disease Maternal Grandfather    Heart disease Paternal Grandmother    COPD Paternal Grandmother    Heart disease Paternal Grandfather    COPD Paternal Grandfather    Heart disease Brother    Hypertension Brother    Heart disease Brother    Obesity Brother  Breast cancer Neg Hx     Social History   Socioeconomic History   Marital status: Single    Spouse name: Not on file   Number of children: 0   Years of education: Not on file   Highest education level: Master's degree (e.g., MA, MS, MEng, MEd, MSW, MBA)  Occupational History   Occupation: Designer, Jewellery  Tobacco Use   Smoking status: Never   Smokeless tobacco: Never  Vaping Use   Vaping status: Never Used  Substance and Sexual Activity   Alcohol use: Never   Drug use: Never   Sexual activity: Not Currently    Birth control/protection: None  Other Topics Concern   Not on file  Social History Narrative   Not on file   Social Drivers of Health   Tobacco Use: Low Risk (01/05/2025)   Patient History     Smoking Tobacco Use: Never    Smokeless Tobacco Use: Never    Passive Exposure: Not on file  Financial Resource Strain: Low Risk (01/04/2025)   Overall Financial Resource Strain (CARDIA)    Difficulty of Paying Living Expenses: Not very hard  Food Insecurity: No Food Insecurity (01/04/2025)   Epic    Worried About Radiation Protection Practitioner of Food in the Last Year: Never true    Ran Out of Food in the Last Year: Never true  Transportation Needs: No Transportation Needs (01/04/2025)   Epic    Lack of Transportation (Medical): No    Lack of Transportation (Non-Medical): No  Physical Activity: Insufficiently Active (01/04/2025)   Exercise Vital Sign    Days of Exercise per Week: 3 days    Minutes of Exercise per Session: 20 min  Stress: No Stress Concern Present (01/04/2025)   [employer] of Occupational Health - Occupational Stress Questionnaire    Feeling of Stress: Only a little  Social Connections: Moderately Isolated (01/04/2025)   Social Connection and Isolation Panel    Frequency of Communication with Friends and Family: More than three times a week    Frequency of Social Gatherings with Friends and Family: Twice a week    Attends Religious Services: Patient declined    Active Member of Clubs or Organizations: Yes    Attends Engineer, Structural: More than 4 times per year    Marital Status: Never married  Intimate Partner Violence: Not At Risk (01/05/2025)   Epic    Fear of Current or Ex-Partner: No    Emotionally Abused: No    Physically Abused: No    Sexually Abused: No  Depression (PHQ2-9): Low Risk (01/05/2025)   Depression (PHQ2-9)    PHQ-2 Score: 1  Alcohol Screen: Low Risk (01/05/2025)   Alcohol Screen    Last Alcohol Screening Score (AUDIT): 1  Housing: Low Risk (01/05/2025)   Epic    Unable to Pay for Housing in the Last Year: No    Number of Times Moved in the Last Year: 0    Homeless in the Last Year: No  Utilities: Not At Risk (01/05/2025)   Epic    Threatened with loss of  utilities: No  Health Literacy: Adequate Health Literacy (01/05/2025)   B1300 Health Literacy    Frequency of need for help with medical instructions: Never    ROS See HPI above    Objective  BP 136/84   Pulse 80   Temp 97.8 F (36.6 C) (Oral)   Ht 5' 9 (1.753 m)   Wt (!) 404 lb (183.3 kg)  LMP 09/27/2023   SpO2 98%   BMI 59.66 kg/m   Physical Exam Vitals reviewed.  Constitutional:      General: She is not in acute distress.    Appearance: Normal appearance. She is morbidly obese. She is not ill-appearing, toxic-appearing or diaphoretic.  HENT:     Head: Normocephalic and atraumatic.  Eyes:     General:        Right eye: No discharge.        Left eye: No discharge.     Conjunctiva/sclera: Conjunctivae normal.  Cardiovascular:     Rate and Rhythm: Normal rate and regular rhythm.     Heart sounds: Normal heart sounds. No murmur heard.    No friction rub. No gallop.  Pulmonary:     Effort: Pulmonary effort is normal. No respiratory distress.     Breath sounds: Normal breath sounds.  Musculoskeletal:        General: Normal range of motion.  Skin:    General: Skin is warm and dry.  Neurological:     General: No focal deficit present.     Mental Status: She is alert and oriented to person, place, and time. Mental status is at baseline.  Psychiatric:        Mood and Affect: Mood normal.        Behavior: Behavior normal.        Thought Content: Thought content normal.        Judgment: Judgment normal.      Assessment & Plan:  Essential hypertension Assessment & Plan: Blood pressure is stable. Continue Chlorthalidone  25mg  daily and Lisinopril  5mg  daily. Ordered CMP.   Orders: -     Comprehensive metabolic panel with GFR  Morbid obesity (HCC) Assessment & Plan: Discontinuing medications Phentermine  and Topiramate  at patients request due to not having successful weight loss.   Orders: -     Comprehensive metabolic panel with GFR -     Lipid panel  Other  hyperlipidemia Assessment & Plan: Stable. Continue Rosuvastatin  20mg  daily. Ordered lipid panel and CMP.   Orders: -     Comprehensive metabolic panel with GFR -     Lipid panel  Vitamin D  deficiency Assessment & Plan: Continue supplement. Ordered Vitamin D  level.   Orders: -     VITAMIN D  25 Hydroxy (Vit-D Deficiency, Fractures)  Prediabetes Assessment & Plan: No medication management. Lifestyle changes. Ordered A1c.   Orders: -     Hemoglobin A1c  Colon cancer screening -     Ambulatory referral to Gastroenterology  Need for hepatitis C screening test -     Hepatitis C antibody  Encounter for screening for HIV -     HIV Antibody (routine testing w rflx)  Encounter to establish care  1.Review health maintenance:  -HIV and Hep C screening: Order -Tdap vaccine: 5 years ago, may upload immunization record -Hep B vaccine: May upload immunization record  -Cervical cancer screening: 3 years ago, Ellouise Seip, NP  -Colonoscopy: Referral to GI  -PNA vaccine: Not had  2.Discussed briefly about Vyvanse for binge eating. Call insurance and send a message if it is covered. Return in about 6 months (around 07/05/2025) for physical.   Renaye Janicki, NP "

## 2025-01-05 NOTE — Assessment & Plan Note (Signed)
 Stable. Continue Rosuvastatin  20mg  daily. Ordered lipid panel and CMP.

## 2025-01-05 NOTE — Assessment & Plan Note (Signed)
 No medication management. Lifestyle changes. Ordered A1c.

## 2025-01-05 NOTE — Assessment & Plan Note (Signed)
 Continue supplement. Ordered Vitamin D  level.

## 2025-01-05 NOTE — Patient Instructions (Addendum)
-  It was nice to meet you and look forward to taking care of you. -Continue all medications.  -Ordered labs. Office will call with lab results and will be available via MyChart. -Placed a referral to gastroenterology for colonoscopy.  -Please upload immunization records. -Discussed briefly about Vyvanse for binge eating. Call insurance and send a message if it is covered. -Follow up in 6 months for physical.

## 2025-01-06 ENCOUNTER — Ambulatory Visit: Payer: Self-pay | Admitting: Family Medicine

## 2025-01-06 LAB — HIV ANTIBODY (ROUTINE TESTING W REFLEX)
HIV 1&2 Ab, 4th Generation: NONREACTIVE
HIV FINAL INTERPRETATION: NEGATIVE

## 2025-01-06 LAB — HEPATITIS C ANTIBODY: Hepatitis C Ab: NONREACTIVE

## 2025-01-12 ENCOUNTER — Ambulatory Visit: Admitting: Family Medicine

## 2025-07-06 ENCOUNTER — Encounter: Admitting: Family Medicine
